# Patient Record
Sex: Male | Born: 1955 | Race: White | Hispanic: No | Marital: Married | Smoking: Former smoker
Health system: Southern US, Community
[De-identification: ages and names within clinical notes are randomized; demographics above are authoritative.]

## PROBLEM LIST (undated history)

## (undated) DIAGNOSIS — C801 Malignant (primary) neoplasm, unspecified: Secondary | ICD-10-CM

## (undated) DIAGNOSIS — E7800 Pure hypercholesterolemia, unspecified: Secondary | ICD-10-CM

## (undated) HISTORY — PX: NECK SURGERY: SHX720

---

## 2005-08-22 ENCOUNTER — Ambulatory Visit: Payer: Self-pay | Admitting: Internal Medicine

## 2006-01-27 ENCOUNTER — Ambulatory Visit: Payer: Self-pay | Admitting: Gastroenterology

## 2008-05-01 ENCOUNTER — Ambulatory Visit: Payer: Self-pay | Admitting: Internal Medicine

## 2008-06-19 ENCOUNTER — Ambulatory Visit (HOSPITAL_COMMUNITY): Admission: RE | Admit: 2008-06-19 | Discharge: 2008-06-20 | Payer: Self-pay | Admitting: Neurosurgery

## 2010-05-04 LAB — CBC
HCT: 41.8 % (ref 39.0–52.0)
MCHC: 34.8 g/dL (ref 30.0–36.0)
Platelets: 204 10*3/uL (ref 150–400)
RDW: 13.1 % (ref 11.5–15.5)

## 2010-05-04 LAB — BASIC METABOLIC PANEL
BUN: 16 mg/dL (ref 6–23)
CO2: 30 mEq/L (ref 19–32)
GFR calc non Af Amer: >60 mL/min (ref >60)
Glucose, Bld: 94 mg/dL (ref 70–99)
Potassium: 4.5 mEq/L (ref 3.5–5.1)

## 2010-06-08 NOTE — Op Note (Signed)
NAME:  Mark Callahan, Mark Callahan NO.:  [id number]   MEDICAL RECORD NO.:  [id number]          PATIENT TYPE:  OIB   LOCATION:  3523                         FACILITY:  [facility]   PHYSICIAN:  Cristi Loron, M.D.DATE OF BIRTH:  04/14/1955   DATE OF PROCEDURE:  06/19/2008  DATE OF DISCHARGE:                               OPERATIVE REPORT   BRIEF HISTORY:  The patient is a 55 year old white male who has suffered  from neck and arm pain consistent with a cervical radiculopathy.  The  patient has failed medical management worked up to be cervical MRI,  which demonstrated that the patient had a C4-5, C5-6 degeneration,  spondylosis, spondylolisthesis, stenosis, etc.  I discussed various  treatment with the patient including surgery.  The patient has weighed  the risks, benefits, and alternatives of surgery and started to proceed  with a C5 corpectomy with instrumentation and fusion of C4-C6.   PREOPERATIVE DIAGNOSES:  C4-5, C5-6 spondylosis, stenosis, cervical  radiculopathy, cervicalgia, spondylolisthesis.   POSTOPERATIVE DIAGNOSES:  C4-5, C5-6 spondylosis, stenosis, cervical  radiculopathy, cervicalgia, spondylolisthesis.   PROCEDURE:  C5 corpectomy; C4-5 and C5-6 anterior interbody arthrodesis  with local morselized autograft bone and active fused bone graft  extender; insertion of PEEK interbody prosthesis in the C5 corpectomy  site; anterior cervical plating at C4-C6 with Codman slim-lock Titanium  plate and screws.   SURGEON:  Cristi Loron, MD   ASSISTANT:  Clydene Fake, MD   ANESTHESIA:  General endotracheal.   ESTIMATED BLOOD LOSS:  100 mL.   SPECIMENS:  None.   DRAINS:  None.   COMPLICATIONS:  None.   DESCRIPTION OF PROCEDURE:  The patient was brought to the operating room  by anesthesia team.  General endotracheal anesthesia was induced.  The  patient remained in supine position with his neck in neutral position.  The patient's anterior neck was  then shaved with clippers and prepared  with Betadine scrub and Betadine solution.  Sterile drapes were applied  and then injected the area to be incised with Marcaine with epinephrine  solution.  I used a scalpel to make a transverse incision in the  patient's left anterior neck.  I used Metzenbaum scissors to divide the  platysma muscle and then to dissect medial to sternocleidomastoid  muscle, jugular vein and carotid artery.  I carefully dissected down  towards the intracervical spine identifying the esophagus and retracting  it medially.  We then used Kitner swab to clear the soft tissue from the  anterior cervical spine and then inserted a bent spinal needle into the  upper exposed intervertebral disk space.  We obtained intraoperative  radiograph to confirm our location.  We then used electrocautery to  detach the medial border of the longus colli muscle bilaterally from C4-  5 and C5-6 intervertebral space.  We inserted the Caspar self-retaining  retractor underneath the longus colli muscle bilaterally for exposure.   We began the decompression by incising the C4-5 and C5-6, intervertebral  disk with a 15 blade scalpel.  We performed a partial intervertebral  diskectomy with a pituitary forceps.  We then inserted distraction  screws into C4 and C6 and distracted the interspaces.  We then used high-  speed drill to drill away the remainder of the C4-5 and C5-6  intervertebral disk to drill away some posterior spondylosis and to thin  out the posterior longitudinal ligament.  We completed the C4-C5  corpectomy using the Leksell rongeur and saved some of this bone we  obtained to be later used as local autograft bone, but the majority of  the corpectomy was done with a high-speed drill.  We then incised the  posterior longitudinal ligament and removed the remainder of the C4-5  and C5-6 intervertebral disk.  We undercut the vertebral endplates at C4-  C6 decompressing the thecal  sac/Spinal cord, we then performed  foraminotomies about the bilateral C5 and C6 nerve roots completing the  decompression.   We now turned our attention to arthrodesis.  We started to use a 24-mm  PEEK, Medtronic (interbody prosthesis).  We assembled the prosthesis and  then prefilled it with a combination of local morselized autograft bone.  We obtained adequate decompression as well as Actifuse bone graft  extender.  We inserted prosthesis into the distracted C5 corpectomy  site.  There was good snug fit of the prosthesis.  We then removed  distraction screws after good snug fit of the prosthesis in the  interspace.  We obtained an intraoperative radiograph to confirm good  position of the prosthesis.   We now turned our attention to the anterior spinal instrumentation.  Having completed the interbody fusion and placement of the prosthesis,  we now turned our attention to the anterior spinal instrumentation.  We  obtained the appropriate length Atlantis transitional plate.  We drilled  out some ventral spondylosis from the vertebral endplates at C4-6, and  the plate will lay down flat.  We laid the plate along the anterior  aspects of the vertebral bodies from C4-C6.  We then used a drill to  drill 30-mm holes at C4-C6, we then secured the plate at vertebral  bodies by placing two 40-mm screws at C4 and 2 at C6 with a good bony  purchase.  We then secured the screws to the plate by locking each cam.  We obtained a final x-ray, which demonstrated good position of plates,  screws, and interbody prosthesis (this has limited the visualization of  the lower plate screws because of the patient's shoulders).  We then  obtained hemostasis using bipolar cautery.  We irrigated the wound out  with bacitracin solution.  We removed the retractors.  We inspected the  esophagus for any damage, but was not apparent and then we  reapproximated the patient's platysma muscle with interrupted 3-0  Vicryl  suture, subcutaneous using the interrupted 3-0 Vicryl suture and the  skin with Steri-Strips and Benzoin.  The wound was then coated with  bacitracin ointment and sterile dressing was applied.  Drapes were  removed and the patient was subsequently extubated by anesthesia team  and transported to post anesthesia care unit in stable condition.  All  sponge, instrument, and needle counts were correct at the end of this  case.      Cristi Loron, M.D.  Electronically Signed     JDJ/[id number]  D:  06/19/2008  T:  06/20/2008  Job:  [id number]

## 2010-07-18 IMAGING — CR DG CHEST 2V
2 series · 2 of 2 positions shown · non-contrast
Comparison: None

CLINICAL DATA: Cervical spondylosis.  Preop respiratory exam.

CHEST - 2 VIEW

[view not recorded (1 of 2)]
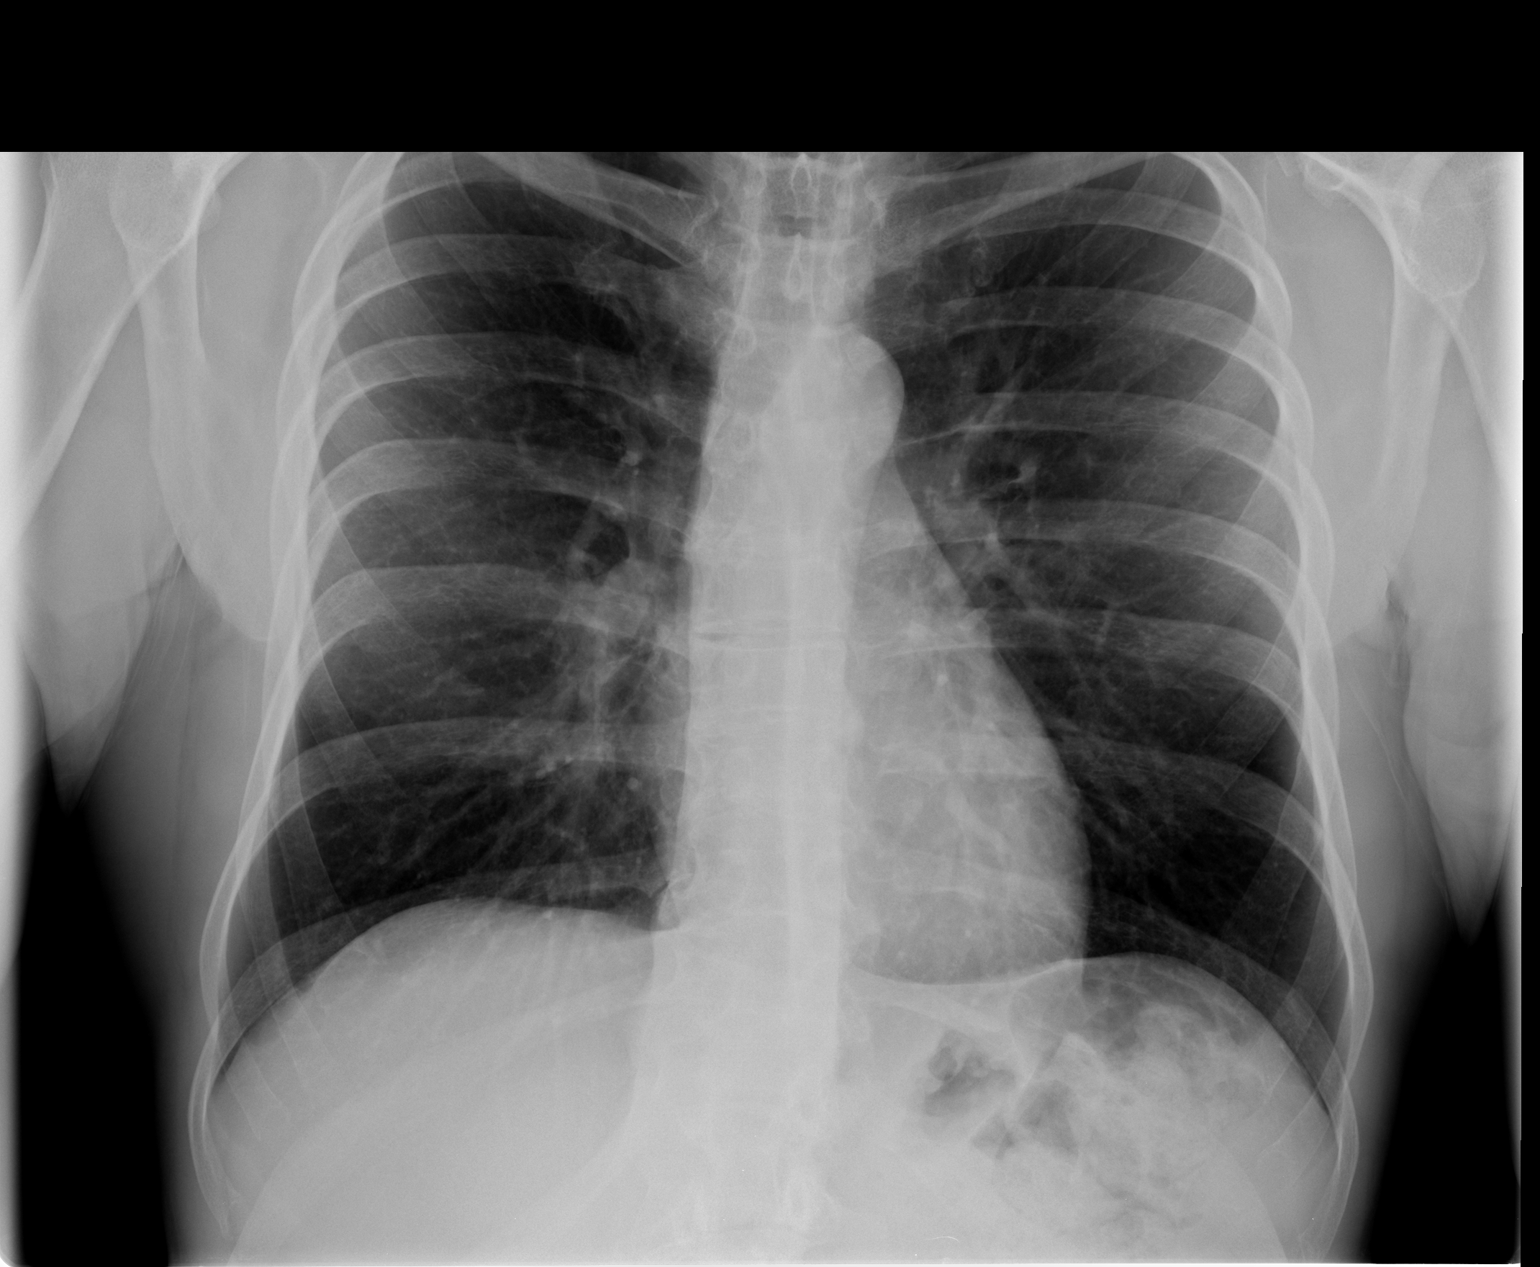

[view not recorded (2 of 2)]
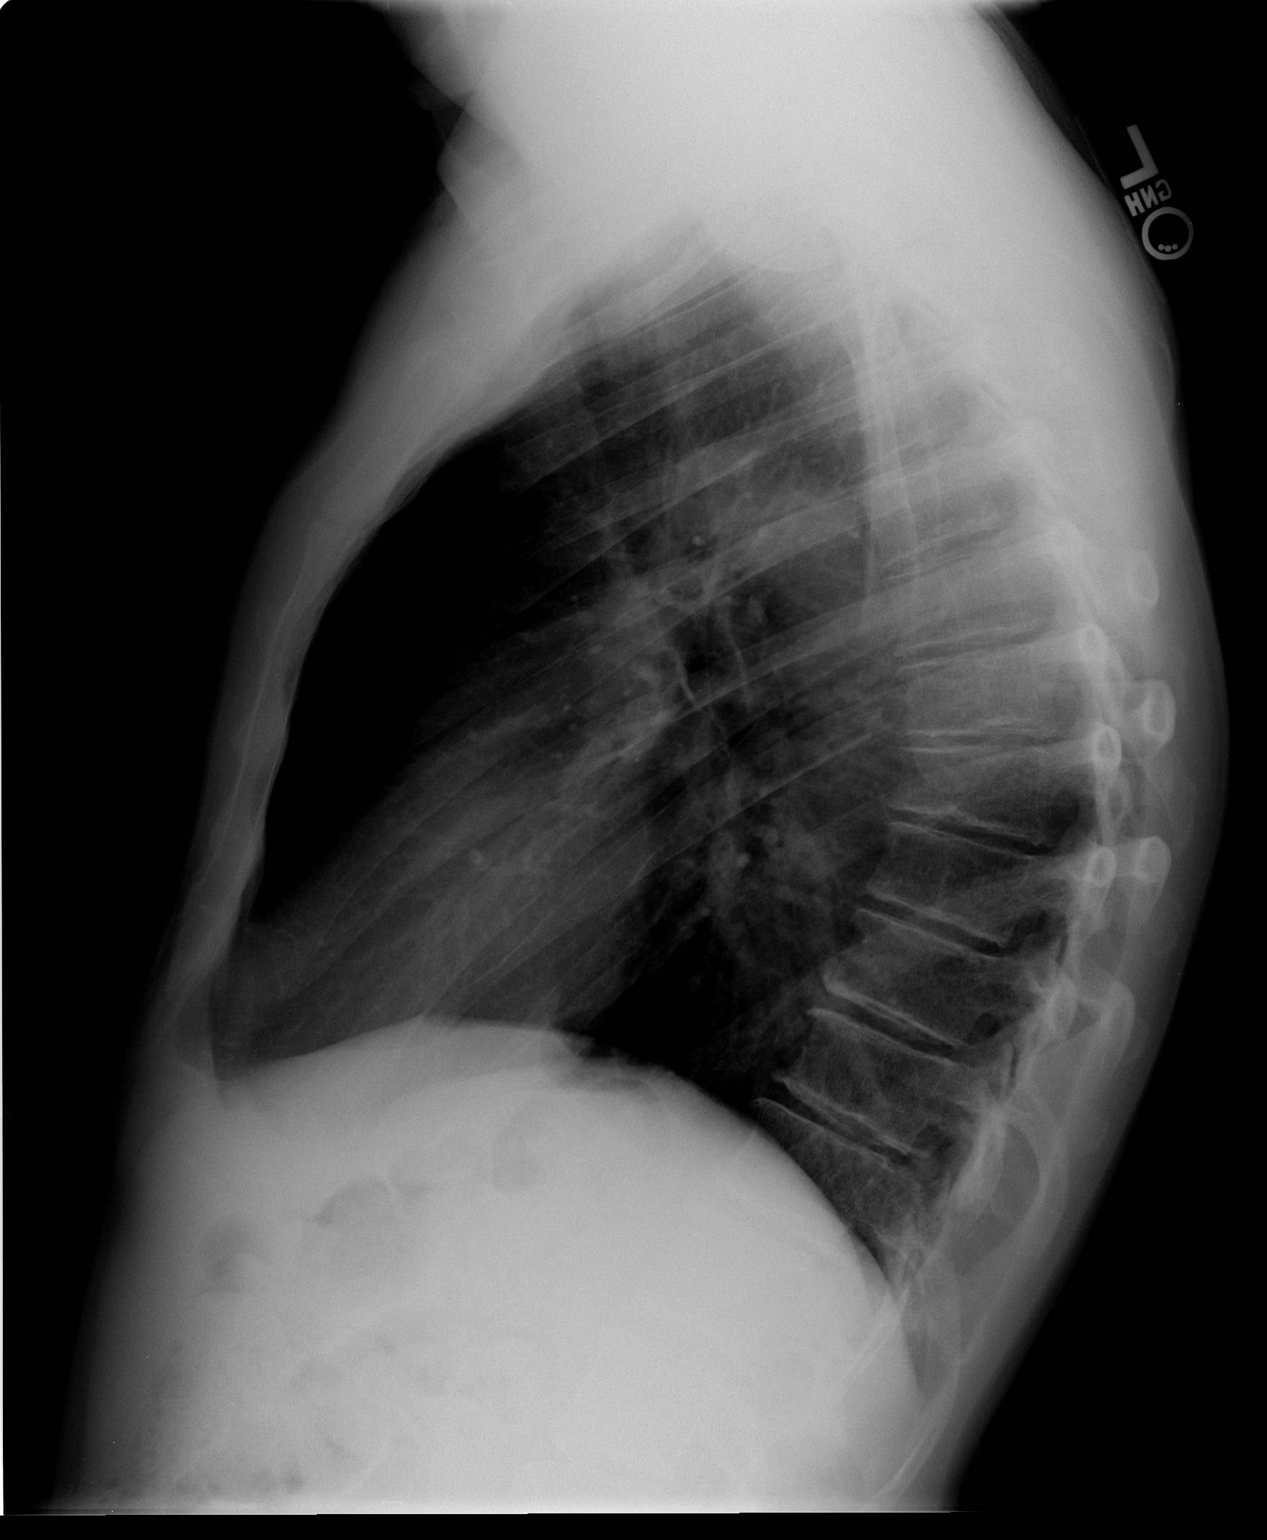

[2 of 2 positions shown; findings below may reference images not displayed]

FINDINGS: The cardiac silhouette, mediastinal and hilar contours
are within normal limits.  Lungs are clear.  The bony thorax is
intact.
IMPRESSION: No acute cardiopulmonary findings.

## 2013-05-17 ENCOUNTER — Ambulatory Visit: Payer: Self-pay | Admitting: Internal Medicine

## 2015-11-06 ENCOUNTER — Other Ambulatory Visit: Payer: Self-pay | Admitting: Physician Assistant

## 2015-11-06 DIAGNOSIS — M546 Pain in thoracic spine: Secondary | ICD-10-CM

## 2015-11-25 ENCOUNTER — Ambulatory Visit
Admission: RE | Admit: 2015-11-25 | Discharge: 2015-11-25 | Disposition: A | Discharge status: Home / Self Care | Payer: BLUE CROSS/BLUE SHIELD | Source: Ambulatory Visit | Attending: Physician Assistant | Admitting: Physician Assistant

## 2015-11-25 DIAGNOSIS — M546 Pain in thoracic spine: Secondary | ICD-10-CM

## 2015-11-25 DIAGNOSIS — M5124 Other intervertebral disc displacement, thoracic region: Secondary | ICD-10-CM | POA: Insufficient documentation

## 2015-12-07 ENCOUNTER — Other Ambulatory Visit
Admission: RE | Admit: 2015-12-07 | Discharge: 2015-12-07 | Disposition: A | Discharge status: Home / Self Care | Payer: BLUE CROSS/BLUE SHIELD | Source: Ambulatory Visit | Attending: Internal Medicine | Admitting: Internal Medicine

## 2015-12-07 ENCOUNTER — Other Ambulatory Visit: Payer: Self-pay | Admitting: Internal Medicine

## 2015-12-07 DIAGNOSIS — R1013 Epigastric pain: Secondary | ICD-10-CM | POA: Diagnosis not present

## 2015-12-07 DIAGNOSIS — R700 Elevated erythrocyte sedimentation rate: Secondary | ICD-10-CM

## 2015-12-07 DIAGNOSIS — R7989 Other specified abnormal findings of blood chemistry: Secondary | ICD-10-CM

## 2015-12-07 LAB — FIBRIN DERIVATIVES D-DIMER (ARMC ONLY): FIBRIN DERIVATIVES D-DIMER (ARMC): 1526 — AB (ref 0–499)

## 2015-12-08 ENCOUNTER — Ambulatory Visit
Admission: RE | Admit: 2015-12-08 | Discharge: 2015-12-08 | Disposition: A | Discharge status: Home / Self Care | Payer: BLUE CROSS/BLUE SHIELD | Source: Ambulatory Visit | Attending: Internal Medicine | Admitting: Internal Medicine

## 2015-12-08 DIAGNOSIS — M47894 Other spondylosis, thoracic region: Secondary | ICD-10-CM | POA: Insufficient documentation

## 2015-12-08 DIAGNOSIS — I2510 Atherosclerotic heart disease of native coronary artery without angina pectoris: Secondary | ICD-10-CM | POA: Diagnosis not present

## 2015-12-08 DIAGNOSIS — R1310 Dysphagia, unspecified: Secondary | ICD-10-CM | POA: Diagnosis not present

## 2015-12-08 DIAGNOSIS — R7989 Other specified abnormal findings of blood chemistry: Secondary | ICD-10-CM

## 2015-12-08 DIAGNOSIS — R1013 Epigastric pain: Secondary | ICD-10-CM

## 2015-12-08 HISTORY — DX: Malignant (primary) neoplasm, unspecified: C80.1

## 2015-12-08 MED ORDER — IOPAMIDOL (ISOVUE-370) INJECTION 76%
75.0000 mL | Freq: Once | INTRAVENOUS | Status: AC | PRN
  Administered 2015-12-08: 75 mL via INTRAVENOUS

## 2015-12-10 ENCOUNTER — Ambulatory Visit
Admission: RE | Admit: 2015-12-10 | Discharge: 2015-12-10 | Disposition: A | Discharge status: Home / Self Care | Payer: BLUE CROSS/BLUE SHIELD | Source: Ambulatory Visit | Attending: Internal Medicine | Admitting: Internal Medicine

## 2015-12-10 DIAGNOSIS — R1013 Epigastric pain: Secondary | ICD-10-CM | POA: Insufficient documentation

## 2015-12-10 DIAGNOSIS — N200 Calculus of kidney: Secondary | ICD-10-CM | POA: Insufficient documentation

## 2015-12-10 DIAGNOSIS — N281 Cyst of kidney, acquired: Secondary | ICD-10-CM | POA: Insufficient documentation

## 2015-12-10 DIAGNOSIS — I700 Atherosclerosis of aorta: Secondary | ICD-10-CM | POA: Insufficient documentation

## 2015-12-10 DIAGNOSIS — R700 Elevated erythrocyte sedimentation rate: Secondary | ICD-10-CM | POA: Insufficient documentation

## 2015-12-10 LAB — POCT I-STAT CREATININE: CREATININE: 0.8 mg/dL (ref 0.61–1.24)

## 2015-12-10 MED ORDER — IOPAMIDOL (ISOVUE-300) INJECTION 61%
100.0000 mL | Freq: Once | INTRAVENOUS | Status: AC | PRN
  Administered 2015-12-10: 100 mL via INTRAVENOUS

## 2015-12-14 ENCOUNTER — Ambulatory Visit: Payer: BLUE CROSS/BLUE SHIELD

## 2015-12-31 ENCOUNTER — Emergency Department: Payer: BLUE CROSS/BLUE SHIELD

## 2015-12-31 ENCOUNTER — Inpatient Hospital Stay (HOSPITAL_COMMUNITY)
Admission: AD | Admit: 2015-12-31 | Discharge: 2016-01-03 | DRG: 378 | Disposition: A | Discharge status: Home / Self Care | Payer: BLUE CROSS/BLUE SHIELD | Source: Other Acute Inpatient Hospital | Attending: Internal Medicine | Admitting: Internal Medicine

## 2015-12-31 ENCOUNTER — Encounter: Payer: Self-pay | Admitting: Emergency Medicine

## 2015-12-31 ENCOUNTER — Emergency Department
Admission: EM | Admit: 2015-12-31 | Discharge: 2015-12-31 | Payer: BLUE CROSS/BLUE SHIELD | Attending: Emergency Medicine | Admitting: Emergency Medicine

## 2015-12-31 ENCOUNTER — Encounter (HOSPITAL_COMMUNITY): Payer: Self-pay

## 2015-12-31 DIAGNOSIS — Z79899 Other long term (current) drug therapy: Secondary | ICD-10-CM

## 2015-12-31 DIAGNOSIS — K921 Melena: Secondary | ICD-10-CM | POA: Diagnosis present

## 2015-12-31 DIAGNOSIS — K449 Diaphragmatic hernia without obstruction or gangrene: Secondary | ICD-10-CM | POA: Diagnosis not present

## 2015-12-31 DIAGNOSIS — I10 Essential (primary) hypertension: Secondary | ICD-10-CM | POA: Diagnosis not present

## 2015-12-31 DIAGNOSIS — E785 Hyperlipidemia, unspecified: Secondary | ICD-10-CM | POA: Diagnosis present

## 2015-12-31 DIAGNOSIS — Z87891 Personal history of nicotine dependence: Secondary | ICD-10-CM | POA: Diagnosis not present

## 2015-12-31 DIAGNOSIS — K209 Esophagitis, unspecified: Secondary | ICD-10-CM | POA: Diagnosis present

## 2015-12-31 DIAGNOSIS — K254 Chronic or unspecified gastric ulcer with hemorrhage: Principal | ICD-10-CM | POA: Diagnosis present

## 2015-12-31 DIAGNOSIS — D500 Iron deficiency anemia secondary to blood loss (chronic): Secondary | ICD-10-CM | POA: Diagnosis not present

## 2015-12-31 DIAGNOSIS — E872 Acidosis, unspecified: Secondary | ICD-10-CM | POA: Diagnosis present

## 2015-12-31 DIAGNOSIS — E876 Hypokalemia: Secondary | ICD-10-CM | POA: Diagnosis not present

## 2015-12-31 DIAGNOSIS — E860 Dehydration: Secondary | ICD-10-CM | POA: Diagnosis present

## 2015-12-31 DIAGNOSIS — K269 Duodenal ulcer, unspecified as acute or chronic, without hemorrhage or perforation: Secondary | ICD-10-CM | POA: Diagnosis not present

## 2015-12-31 DIAGNOSIS — Z7982 Long term (current) use of aspirin: Secondary | ICD-10-CM | POA: Diagnosis not present

## 2015-12-31 DIAGNOSIS — K922 Gastrointestinal hemorrhage, unspecified: Secondary | ICD-10-CM | POA: Diagnosis not present

## 2015-12-31 DIAGNOSIS — K2971 Gastritis, unspecified, with bleeding: Secondary | ICD-10-CM | POA: Diagnosis not present

## 2015-12-31 DIAGNOSIS — Z85828 Personal history of other malignant neoplasm of skin: Secondary | ICD-10-CM | POA: Insufficient documentation

## 2015-12-31 DIAGNOSIS — D62 Acute posthemorrhagic anemia: Secondary | ICD-10-CM | POA: Diagnosis present

## 2015-12-31 DIAGNOSIS — R55 Syncope and collapse: Secondary | ICD-10-CM | POA: Diagnosis present

## 2015-12-31 HISTORY — DX: Pure hypercholesterolemia, unspecified: E78.00

## 2015-12-31 LAB — COMPREHENSIVE METABOLIC PANEL
ALBUMIN: 1.9 g/dL — AB (ref 3.5–5.0)
ALT: 14 U/L — AB (ref 17–63)
AST: 25 U/L (ref 15–41)
Alkaline Phosphatase: 25 U/L — ABNORMAL LOW (ref 38–126)
Anion gap: 14 (ref 5–15)
BUN: 32 mg/dL — AB (ref 6–20)
CHLORIDE: 108 mmol/L (ref 101–111)
CO2: 17 mmol/L — AB (ref 22–32)
CREATININE: 1.11 mg/dL (ref 0.61–1.24)
Calcium: 7.8 mg/dL — ABNORMAL LOW (ref 8.9–10.3)
GFR calc Af Amer: >60 mL/min (ref >60)
GFR calc non Af Amer: >60 mL/min (ref >60)
GLUCOSE: 262 mg/dL — AB (ref 65–99)
Potassium: 3.8 mmol/L (ref 3.5–5.1)
SODIUM: 139 mmol/L (ref 135–145)
Total Bilirubin: 0.2 mg/dL — ABNORMAL LOW (ref 0.3–1.2)
Total Protein: 4.8 g/dL — ABNORMAL LOW (ref 6.5–8.1)

## 2015-12-31 LAB — CBC
HEMATOCRIT: 15.8 % — AB (ref 39.0–52.0)
HEMOGLOBIN: 5.3 g/dL — AB (ref 13.0–17.0)
MCH: 29.4 pg (ref 26.0–34.0)
MCHC: 33.5 g/dL (ref 30.0–36.0)
MCV: 87.8 fL (ref 78.0–100.0)
Platelets: 351 10*3/uL (ref 150–400)
RBC: 1.8 MIL/uL — AB (ref 4.22–5.81)
RDW: 13.2 % (ref 11.5–15.5)
WBC: 14.2 10*3/uL — AB (ref 4.0–10.5)

## 2015-12-31 LAB — CBC WITH DIFFERENTIAL/PLATELET
Basophils Absolute: 0 10*3/uL (ref 0–0.1)
Basophils Relative: 0 %
EOS PCT: 2 %
Eosinophils Absolute: 0.3 10*3/uL (ref 0–0.7)
HEMATOCRIT: 13.4 % — AB (ref 40.0–52.0)
Hemoglobin: 4.2 g/dL — CL (ref 13.0–18.0)
LYMPHS ABS: 2.5 10*3/uL (ref 1.0–3.6)
Lymphocytes Relative: 15 %
MCH: 28.1 pg (ref 26.0–34.0)
MCHC: 31.4 g/dL — ABNORMAL LOW (ref 32.0–36.0)
MCV: 89.3 fL (ref 80.0–100.0)
MONOS PCT: 5 %
Monocytes Absolute: 0.8 10*3/uL (ref 0.2–1.0)
NEUTROS ABS: 13.1 10*3/uL — AB (ref 1.4–6.5)
Neutrophils Relative %: 78 %
Platelets: 566 10*3/uL — ABNORMAL HIGH (ref 150–440)
RBC: 1.5 MIL/uL — AB (ref 4.40–5.90)
RDW: 14.2 % (ref 11.5–14.5)
WBC: 16.7 10*3/uL — AB (ref 3.8–10.6)

## 2015-12-31 LAB — PREPARE RBC (CROSSMATCH)

## 2015-12-31 LAB — ABO/RH
ABO/RH(D): A POS
ABO/RH(D): A POS

## 2015-12-31 LAB — LACTIC ACID, PLASMA: Lactic Acid, Venous: 8.5 mmol/L (ref 0.5–1.9)

## 2015-12-31 LAB — TROPONIN I: Troponin I: <0.03 ng/mL (ref <0.03)

## 2015-12-31 LAB — MRSA PCR SCREENING: MRSA BY PCR: NEGATIVE

## 2015-12-31 MED ORDER — SODIUM CHLORIDE 0.9 % IV SOLN
10.0000 mL/h | Freq: Once | INTRAVENOUS | Status: AC
  Administered 2015-12-31: 10 mL/h via INTRAVENOUS

## 2015-12-31 MED ORDER — ONDANSETRON HCL 4 MG/2ML IJ SOLN: 4.0000 mg | Freq: Four times a day (QID) | INTRAMUSCULAR | Status: DC | PRN

## 2015-12-31 MED ORDER — SODIUM CHLORIDE 0.9 % IV SOLN
8.0000 mg/h | INTRAVENOUS | Status: DC
  Administered 2015-12-31: 8 mg/h via INTRAVENOUS
  Filled 2015-12-31: qty 80

## 2015-12-31 MED ORDER — PANTOPRAZOLE SODIUM 40 MG IV SOLR: 40.0000 mg | Freq: Two times a day (BID) | INTRAVENOUS | Status: DC

## 2015-12-31 MED ORDER — HYDROCODONE-ACETAMINOPHEN 10-325 MG PO TABS
1.0000 | ORAL_TABLET | ORAL | Status: DC | PRN
  Administered 2015-12-31 – 2016-01-03 (×5): 1 via ORAL
  Filled 2015-12-31 (×5): qty 1

## 2015-12-31 MED ORDER — SODIUM CHLORIDE 0.9 % IV SOLN
Freq: Once | INTRAVENOUS | Status: AC
  Administered 2015-12-31: 17:00:00 via INTRAVENOUS

## 2015-12-31 MED ORDER — SODIUM CHLORIDE 0.9 % IV SOLN
8.0000 mg/h | INTRAVENOUS | Status: DC
  Administered 2015-12-31 – 2016-01-01 (×3): 8 mg/h via INTRAVENOUS
  Filled 2015-12-31 (×7): qty 80

## 2015-12-31 MED ORDER — ACETAMINOPHEN 500 MG PO TABS: 1000.0000 mg | ORAL_TABLET | Freq: Four times a day (QID) | ORAL | Status: DC | PRN

## 2015-12-31 MED ORDER — BOOST / RESOURCE BREEZE PO LIQD
1.0000 | Freq: Three times a day (TID) | ORAL | Status: DC
  Administered 2015-12-31 – 2016-01-02 (×4): 1 via ORAL

## 2015-12-31 MED ORDER — ACETAMINOPHEN 650 MG RE SUPP: 650.0000 mg | Freq: Four times a day (QID) | RECTAL | Status: DC | PRN

## 2015-12-31 MED ORDER — ORAL CARE MOUTH RINSE
15.0000 mL | Freq: Two times a day (BID) | OROMUCOSAL | Status: DC
  Administered 2016-01-01 – 2016-01-03 (×4): 15 mL via OROMUCOSAL

## 2015-12-31 MED ORDER — SODIUM CHLORIDE 0.9 % IV BOLUS (SEPSIS)
1000.0000 mL | Freq: Once | INTRAVENOUS | Status: AC
  Administered 2015-12-31: 1000 mL via INTRAVENOUS

## 2015-12-31 MED ORDER — ATORVASTATIN CALCIUM 40 MG PO TABS
40.0000 mg | ORAL_TABLET | Freq: Every evening | ORAL | Status: DC
  Administered 2015-12-31 – 2016-01-02 (×3): 40 mg via ORAL
  Filled 2015-12-31 (×3): qty 1

## 2015-12-31 MED ORDER — PIPERACILLIN-TAZOBACTAM 3.375 G IVPB 30 MIN
3.3750 g | Freq: Once | INTRAVENOUS | Status: AC
  Administered 2015-12-31: 3.375 g via INTRAVENOUS
  Filled 2015-12-31: qty 50

## 2015-12-31 MED ORDER — SODIUM CHLORIDE 0.9 % IV SOLN
80.0000 mg | INTRAVENOUS | Status: AC
  Administered 2015-12-31: 11:00:00 80 mg via INTRAVENOUS
  Filled 2015-12-31: qty 80

## 2015-12-31 MED ORDER — ONDANSETRON HCL 4 MG PO TABS: 4.0000 mg | ORAL_TABLET | Freq: Four times a day (QID) | ORAL | Status: DC | PRN

## 2015-12-31 MED ORDER — PANTOPRAZOLE SODIUM 40 MG IV SOLR
80.0000 mg | Freq: Once | INTRAVENOUS | Status: DC
  Filled 2015-12-31: qty 80

## 2015-12-31 MED ORDER — SODIUM CHLORIDE 0.9% FLUSH
3.0000 mL | Freq: Two times a day (BID) | INTRAVENOUS | Status: DC
  Administered 2015-12-31 – 2016-01-03 (×7): 3 mL via INTRAVENOUS

## 2015-12-31 MED ORDER — VITAMIN D 1000 UNITS PO TABS
1000.0000 [IU] | ORAL_TABLET | Freq: Every day | ORAL | Status: DC
  Administered 2015-12-31 – 2016-01-03 (×3): 1000 [IU] via ORAL
  Filled 2015-12-31 (×3): qty 1

## 2015-12-31 MED ORDER — ACETAMINOPHEN 325 MG PO TABS
650.0000 mg | ORAL_TABLET | Freq: Four times a day (QID) | ORAL | Status: DC | PRN
  Administered 2015-12-31 – 2016-01-02 (×3): 650 mg via ORAL
  Filled 2015-12-31 (×3): qty 2

## 2015-12-31 MED ORDER — VANCOMYCIN HCL IN DEXTROSE 1-5 GM/200ML-% IV SOLN
1000.0000 mg | Freq: Once | INTRAVENOUS | Status: AC
  Administered 2015-12-31: 1000 mg via INTRAVENOUS
  Filled 2015-12-31: qty 200

## 2015-12-31 MED ORDER — SODIUM CHLORIDE 0.9 % IV SOLN
80.0000 mg | Freq: Once | INTRAVENOUS | Status: AC
  Administered 2015-12-31: 80 mg via INTRAVENOUS
  Filled 2015-12-31: qty 80

## 2015-12-31 MED ORDER — SODIUM CHLORIDE 0.9 % IV SOLN
80.0000 mg | Freq: Once | INTRAVENOUS | Status: DC
  Filled 2015-12-31: qty 80

## 2015-12-31 MED ORDER — SODIUM CHLORIDE 0.9 % IV SOLN
INTRAVENOUS | Status: DC
  Administered 2015-12-31 – 2016-01-02 (×5): via INTRAVENOUS

## 2015-12-31 NOTE — H&P (Signed)
History and Physical        Hospital Admission Note Date: 12/31/2015  Patient name: Mark Callahan Medical record number: [id number] Date of birth: September 20, 1955 Age: 60 y.o. Gender: male  PCP: BERT Odetta Pink, MD   Referring physician: Dr Reita Cliche  Patient coming from: home    Chief Complaint:  Rectal bleeding and passed out  HPI: Patient is a 60 year old male with hyperlipidemia who presented to [facility] With rectal bleeding and syncopal episode today. History was obtained from the patient who reported that he was having back pain in the last 1 week. Hence he was taking aspirin 325 mg daily at least 6 times a day with Percocet and hydrocodone, with diclofenac. The back pain has improved however in the last 3 days he started having black stools, 2 episodes yesterday. Today while he was sitting on the toilet and felt like he had a bowel movement, he passed out. No chest pain or any palpitations, shortness of breath.   Colonoscopy at [facility] 10 years ago was normal per patient. No prior EGD.  Patient also reported intermittent sharp epigastric pain in the last 3 months, sometimes worse after eating.  ED work-up/course:  BMET unremarkable except glucose 262, troponin less than 0.03 Lactic acid 8.5 WBC 16.7, hemoglobin 4.2, hematocrit 13.4, MCV 89.3, platelets 566  Review of Systems: Positives marked in 'bold' Constitutional: Denies fever, chills, diaphoresis, poor appetite and fatigue.  HEENT: Denies photophobia, eye pain, redness, hearing loss, ear pain, congestion, sore throat, rhinorrhea, sneezing, mouth sores, trouble swallowing, neck pain, neck stiffness and tinnitus.   Respiratory: Denies SOB, DOE, cough, chest tightness,  and wheezing.   Cardiovascular: Denies chest pain, palpitations and leg swelling.  Gastrointestinal: Please see history of present illness.    Genitourinary: Denies dysuria, urgency, frequency, hematuria, flank pain and difficulty urinating.  Musculoskeletal: Denies myalgias, back pain, joint swelling, arthralgias and gait problem.  Skin: Denies pallor, rash and wound.  Neurological: Denies dizziness, seizures, syncope, weakness, light-headedness, numbness and headaches.  Hematological: Denies adenopathy. Easy bruising, personal or family bleeding history  Psychiatric/Behavioral: Denies suicidal ideation, mood changes, confusion, nervousness, sleep disturbance and agitation  Past Medical History: Past Medical History:  Diagnosis Date  . Cancer ([place])    skin ca  . High cholesterol     Past Surgical History:  Procedure Laterality Date  . NECK SURGERY      Medications: Prior to Admission medications   Medication Sig Start Date End Date Taking? Authorizing Provider  acetaminophen (TYLENOL) 500 MG tablet Take 1,000 mg by mouth every 6 (six) hours as needed for moderate pain.   Yes Historical Provider, MD  aspirin 325 MG tablet Take 650 mg by mouth 3 (three) times daily as needed for moderate pain or headache.   Yes Historical Provider, MD  aspirin EC 81 MG tablet Take 81 mg by mouth daily.   Yes Historical Provider, MD  atorvastatin (LIPITOR) 40 MG tablet Take 40 mg by mouth every evening.  06/04/15  Yes Historical Provider, MD  B Complex-Biotin-FA (B-COMPLEX PO) Take 1 tablet by mouth daily.   Yes Historical Provider, MD  Cholecalciferol (VITAMIN D3) 1000 units CAPS  Take 1,000 Units by mouth daily.   Yes Historical Provider, MD  Cyanocobalamin (B-12 PO) Take 1 tablet by mouth daily with lunch.    Yes Historical Provider, MD  diazepam (VALIUM) 5 MG tablet Take 5 mg by mouth every 12 (twelve) hours as needed for anxiety. 12/07/15  Yes Historical Provider, MD  diclofenac (VOLTAREN) 75 MG EC tablet Take 75 mg by mouth 2 (two) times daily. inflammation 06/04/15  Yes Historical Provider, MD  DOCOSAHEXAENOIC ACID PO Take 1 g by mouth 2  (two) times daily.   Yes Historical Provider, MD  Ferrous Sulfate (IRON) 325 (65 Fe) MG TABS Take 162.5 mg by mouth daily.   Yes Historical Provider, MD  HYDROcodone-acetaminophen (NORCO) 10-325 MG tablet Take 1 tablet by mouth every 8 (eight) hours as needed for pain. 02/01/16  Yes Historical Provider, MD  Multiple Vitamin (MULTI-VITAMINS) TABS Take 1 tablet by mouth daily.   Yes Historical Provider, MD  oxyCODONE-acetaminophen (PERCOCET) 10-325 MG tablet Take 1 tablet by mouth every 8 (eight) hours as needed for pain. 12/23/15  Yes Historical Provider, MD  pyridOXINE (B-6) 50 MG tablet Take 100 mg by mouth every evening.    Yes Historical Provider, MD  predniSONE (DELTASONE) 10 MG tablet Take 10-40 mg by mouth as directed. Take 40mg  for 2 days, then 30mg  for 2 days, then 20mg  for 2 days and then 10mg  for 2 days, completed course around 12/31/15 12/23/15 01/02/16  Historical Provider, MD    Allergies:  No Known Allergies  Social History:  has no tobacco, alcohol, and drug history on file.  Family History: No family history on file.  Physical Exam: Blood pressure 119/62, temperature 98.4 F (36.9 C), temperature source Oral, resp. rate 19, height 5\' 8"  (1.727 m), weight 72.1 kg (158 lb 15.2 oz). General: Alert, awake, oriented x3, in no acute distress. HEENT: normocephalic, atraumatic, anicteric sclera, pale conjunctiva, pupils equal and reactive to light and accomodation, oropharynx clear Neck: supple, no masses or lymphadenopathy, no goiter, no bruits  Heart: Regular rate and rhythm, without murmurs, rubs or gallops. Lungs: Clear to auscultation bilaterally, no wheezing, rales or rhonchi. Abdomen: Soft, nontender, nondistended, positive bowel sounds, no masses. Extremities: No clubbing, cyanosis or edema with positive pedal pulses. Neuro: Grossly intact, no focal neurological deficits, strength 5/5 upper and lower extremities bilaterally Psych: alert and oriented x 3, normal mood and  affect Skin: no rashes or lesions, warm and dry, pale   LABS on Admission:  Basic Metabolic Panel:  Recent Labs Lab 12/31/15 1003  NA 139  K 3.8  CL 108  CO2 17*  GLUCOSE 262*  BUN 32*  CREATININE 1.11  CALCIUM 7.8*   Liver Function Tests:  Recent Labs Lab 12/31/15 1003  AST 25  ALT 14*  ALKPHOS 25*  BILITOT 0.2*  PROT 4.8*  ALBUMIN 1.9*   No results for input(s): LIPASE, AMYLASE in the last 168 hours. No results for input(s): AMMONIA in the last 168 hours. CBC:  Recent Labs Lab 12/31/15 1003  WBC 16.7*  NEUTROABS 13.1*  HGB 4.2*  HCT 13.4*  MCV 89.3  PLT 566*   Cardiac Enzymes:  Recent Labs Lab 12/31/15 1003  TROPONINI <0.03   BNP: Invalid input(s): POCBNP CBG: No results for input(s): GLUCAP in the last 168 hours.  Radiological Exams on Admission:  No results found.  *I have personally reviewed the images above*     Assessment/Plan Principal Problem:   Acute blood loss anemia Secondary to GI bleed :  Secondary to excessive aspirin and NSAID use, hemoglobin 4.2 with time of admission - admit to SDU, placed on IV fluid hydration.  - per EDP at [facility], patient was transfused 2 units at [facility] ED. Repeat CBC and patient will likely need 2 more units of packed RBC transfusion, will order, to the goal hemoglobin more than 8 - No NSAIDs, placed on PPI drip, GI consulted, EGD in a.m. - Clear liquid diet for now, nothing by mouth after midnight - Anemia panel  Active Problems:    Hyperlipidemia - Continue statin    Lactic acidosis, Dehydration - Repeat lactic acid in a.m., continue IV fluid hydration   DVT prophylaxis: SCDs  CODE STATUS: Full CODE STATUS  Consults called:   Family Communication: Admission, patients condition and plan of care including tests being ordered have been discussed with the patient who indicates understanding and agree with the plan and Code Status  Admission status:   Disposition  plan: Further plan will depend as patient's clinical course evolves and further radiologic and laboratory data become available. At the time of admission, it appears that the appropriate admission status for this patient is INPATIENT . This is judged to be reasonable and necessary in order to provide the required intensity of service to ensure the patient's safety given the presenting symptoms, physical exam findings, and initial radiographic and laboratory data in the context of their chronic comorbidities.     Time Spent on Admission: 55mins   Andee Chivers M.D. [facility] 12/31/2015, 2:49 PM Pager: [phone]  If 7PM-7AM, please contact night-coverage [email or web address] Password [email or web address]

## 2015-12-31 NOTE — Plan of Care (Signed)
Called by carelink from [facility] ED by Dr Reita Cliche  Briefly 60 year old male presenting with melanotic stools for 3 days  and syncopal episode.   Hemoglobin 4.2, hematocrit 13.4. Lactic acid 8.5. Creatinine 1.1. WBC's 16.7. Initially patient was hypotensive, BP in the 90s however has improved to 101/60 with IV fluids. Initially hypothermic with temperature 95.92F now improved to 99.  No GI coverage at [facility]  Per Dr. Reita Cliche, blood cultures obtained, patient started on empiric broad-spectrum IV antibiotics and 3 units packed RBC transfusion. She will contact GI at [place].  Accepted to stepdown. Will need to notify GI on arrival. May need CT of the abdomen and pelvis to rule out any colitis.   RAI,RIPUDEEP M.D. [facility] 12/31/2015, 12:47 PM  Pager: [phone]

## 2015-12-31 NOTE — ED Triage Notes (Signed)
Pt here from home via [facility] after some rectal bleeding and syncopal episode. EMS gave 250cc of fluid IV, pt A/O upon arrival.

## 2015-12-31 NOTE — Consult Note (Signed)
[facility] Consultation Note  Referring Provider:  Dr. Estill Cotta [facility]) Primary Care Physician:  [name], MD  Reason for Consultation:  Melena, anemia  HPI: Mark Callahan is a 60 y.o. male admitted for above reasons.  Patient had syncopal episode today, found to have Hgb 4.2.  Has had few days' of of melena.  Chronic occasional blood on tissue paper with passage of hard stools in setting of chronic narcotic use for back pain.  Has had escalating back pain recently, and has been taking several aspirin per day and a few acetaminophen per day as well.  No hematemesis.  23-month history of intermittent sharp epigastric pain, worse with movement and sometimes worse after eating.  Colonoscopy in [place] about 10 years ago, normal per patient recollection.  No prior endoscopy.   Past Medical History:  Diagnosis Date  . Cancer ([place])    skin ca  . High cholesterol     Past Surgical History:  Procedure Laterality Date  . NECK SURGERY      Prior to Admission medications   Medication Sig Start Date End Date Taking? Authorizing Provider  acetaminophen (TYLENOL) 500 MG tablet Take 1,000 mg by mouth every 6 (six) hours as needed for moderate pain.   Yes Historical Provider, MD  aspirin 325 MG tablet Take 650 mg by mouth 3 (three) times daily as needed for moderate pain or headache.   Yes Historical Provider, MD  aspirin EC 81 MG tablet Take 81 mg by mouth daily.   Yes Historical Provider, MD  atorvastatin (LIPITOR) 40 MG tablet Take 40 mg by mouth every evening.  06/04/15  Yes Historical Provider, MD  B Complex-Biotin-FA (B-COMPLEX PO) Take 1 tablet by mouth daily.   Yes Historical Provider, MD  Cholecalciferol (VITAMIN D3) 1000 units CAPS Take 1,000 Units by mouth daily.   Yes Historical Provider, MD  Cyanocobalamin (B-12 PO) Take 1 tablet by mouth daily with lunch.    Yes Historical Provider, MD  diazepam (VALIUM) 5 MG tablet Take 5 mg by mouth every 12 (twelve) hours as  needed for anxiety. 12/07/15  Yes Historical Provider, MD  diclofenac (VOLTAREN) 75 MG EC tablet Take 75 mg by mouth 2 (two) times daily. inflammation 06/04/15  Yes Historical Provider, MD  DOCOSAHEXAENOIC ACID PO Take 1 g by mouth 2 (two) times daily.   Yes Historical Provider, MD  Ferrous Sulfate (IRON) 325 (65 Fe) MG TABS Take 162.5 mg by mouth daily.   Yes Historical Provider, MD  HYDROcodone-acetaminophen (NORCO) 10-325 MG tablet Take 1 tablet by mouth every 8 (eight) hours as needed for pain. 02/01/16  Yes Historical Provider, MD  Multiple Vitamin (MULTI-VITAMINS) TABS Take 1 tablet by mouth daily.   Yes Historical Provider, MD  oxyCODONE-acetaminophen (PERCOCET) 10-325 MG tablet Take 1 tablet by mouth every 8 (eight) hours as needed for pain. 12/23/15  Yes Historical Provider, MD  pyridOXINE (B-6) 50 MG tablet Take 100 mg by mouth every evening.    Yes Historical Provider, MD  predniSONE (DELTASONE) 10 MG tablet Take 10-40 mg by mouth as directed. Take 40mg  for 2 days, then 30mg  for 2 days, then 20mg  for 2 days and then 10mg  for 2 days, completed course around 12/31/15 12/23/15 01/02/16  Historical Provider, MD    Current Facility-Administered Medications  Medication Dose Route Frequency Provider Last Rate Last Dose  . 0.9 %  sodium chloride infusion   Intravenous Continuous Ripudeep Krystal Eaton, MD      .  0.9 %  sodium chloride infusion   Intravenous Once Ripudeep K Rai, MD      . acetaminophen (TYLENOL) tablet 650 mg  650 mg Oral Q6H PRN Ripudeep Krystal Eaton, MD       Or  . acetaminophen (TYLENOL) suppository 650 mg  650 mg Rectal Q6H PRN Ripudeep K Rai, MD      . atorvastatin (LIPITOR) tablet 40 mg  40 mg Oral QPM Ripudeep K Rai, MD      . cholecalciferol (VITAMIN D) tablet 1,000 Units  1,000 Units Oral Daily Ripudeep K Rai, MD      . HYDROcodone-acetaminophen (NORCO) 10-325 MG per tablet 1 tablet  1 tablet Oral Q4H PRN Ripudeep K Rai, MD      . MEDLINE mouth rinse  15 mL Mouth Rinse BID Ripudeep K  Rai, MD      . ondansetron (ZOFRAN) tablet 4 mg  4 mg Oral Q6H PRN Ripudeep Krystal Eaton, MD       Or  . ondansetron (ZOFRAN) injection 4 mg  4 mg Intravenous Q6H PRN Ripudeep K Rai, MD      . pantoprazole (PROTONIX) 80 mg in sodium chloride 0.9 % 100 mL IVPB  80 mg Intravenous Once Ripudeep K Rai, MD      . pantoprazole (PROTONIX) 80 mg in sodium chloride 0.9 % 250 mL (0.32 mg/mL) infusion  8 mg/hr Intravenous Continuous Ripudeep K Rai, MD      . Derrill Memo ON 01/04/2016] pantoprazole (PROTONIX) injection 40 mg  40 mg Intravenous Q12H Ripudeep K Rai, MD      . sodium chloride flush (NS) 0.9 % injection 3 mL  3 mL Intravenous Q12H Ripudeep Krystal Eaton, MD        Allergies as of 12/31/2015  . (No Known Allergies)    No family history on file.  Social History   Social History  . Marital status: Married    Spouse name: N/A  . Number of children: N/A  . Years of education: N/A   Occupational History  . Not on file.   Social History Main Topics  . Smoking status: Not on file  . Smokeless tobacco: Not on file  . Alcohol use Not on file  . Drug use: Unknown  . Sexual activity: Not on file   Other Topics Concern  . Not on file   Social History Narrative  . No narrative on file    Review of Systems: Positive = bold Gen: Denies any fever, chills, rigors, night sweats, anorexia, fatigue, weakness, malaise, involuntary weight loss, and sleep disorder CV: Denies chest pain, angina, palpitations, syncope, orthopnea, PND, peripheral edema, and claudication. Resp: Denies dyspnea, cough, sputum, wheezing, coughing up blood. GI: Described in detail in HPI.    GU : Denies urinary burning, blood in urine, urinary frequency, urinary hesitancy, nocturnal urination, and urinary incontinence. MS: Denies back pain or swelling.  Denies muscle weakness, cramps, atrophy.  Derm: Denies rash, itching, oral ulcerations, hives, unhealing ulcers.  Psych: Denies depression, anxiety, memory loss, suicidal ideation,  hallucinations,  and confusion. Heme: Denies bruising, bleeding, and enlarged lymph nodes. Neuro:  Denies any headaches, dizziness, paresthesias. Endo:  Denies any problems with DM, thyroid, adrenal function.  Physical Exam: Vital signs in last 24 hours: Temp:  [95.8 F (35.4 C)-99.6 F (37.6 C)] 98.4 F (36.9 C) (12/07 1400) Pulse Rate:  [102-122] 112 (12/07 1249) Resp:  [14-21] 19 (12/07 1400) BP: (100-119)/(52-89) 119/62 (12/07 1400) SpO2:  [99 %-100 %] 100 % (  12/07 1249) Weight:  [69.4 kg (153 lb)-72.1 kg (158 lb 15.2 oz)] 72.1 kg (158 lb 15.2 oz) (12/07 1400)   General:   Alert,  Well-developed, well-nourished, pleasant and cooperative in NAD Head:  Normocephalic and atraumatic. Eyes:  Sclera clear, no icterus.   Conjunctiva pale Ears:  Normal auditory acuity. Nose:  No deformity, discharge,  or lesions. Mouth:  No deformity or lesions.  Oropharynx dry and pale Neck:  Supple; no masses or thyromegaly. Lungs:  Clear throughout to auscultation.   No wheezes, crackles, or rhonchi. No acute distress. Heart:  Regular rate and rhythm; no murmurs, clicks, rubs,  or gallops. Abdomen:  Soft, mild epigastric tenderness without peritonitis, nondistended. No masses, hepatosplenomegaly or hernias noted. Normal bowel sounds, without guarding, and without rebound.     Msk:  Symmetrical without gross deformities. Normal posture. Pulses:  Normal pulses noted. Extremities:  Without clubbing or edema. Neurologic:  Alert and  oriented x4; diffusely weak, otherwise grossly normal neurologically. Skin:  Markedly pale; otherwise intact without significant lesions or rashes. Psych:  Alert and cooperative. Normal mood and affect.   Lab Results:  Recent Labs  12/31/15 1003  WBC 16.7*  HGB 4.2*  HCT 13.4*  PLT 566*   BMET  Recent Labs  12/31/15 1003  NA 139  K 3.8  CL 108  CO2 17*  GLUCOSE 262*  BUN 32*  CREATININE 1.11  CALCIUM 7.8*   LFT  Recent Labs  12/31/15 1003  PROT  4.8*  ALBUMIN 1.9*  AST 25  ALT 14*  ALKPHOS 25*  BILITOT 0.2*   PT/INR No results for input(s): LABPROT, INR in the last 72 hours.  Studies/Results: Dg Chest Port 1 View  Result Date: 12/31/2015 CLINICAL DATA:  Rectal bleeding. EXAM: PORTABLE CHEST 1 VIEW COMPARISON:  CT 12/08/2015 . FINDINGS: Mediastinum and hilar structures normal. Cardiomegaly with normal pulmonary vascularity. No focal infiltrate. No pleural effusion or pneumothorax. Prior cervical spine fusion. IMPRESSION: No acute cardiopulmonary disease. Electronically Signed   By: Marcello Moores  Register   On: 12/31/2015 10:33   Impression:  1.  Melena. Non-destabilizing. 2.  Acute blood loss anemia. 3.  Epigastric pain, progressive for several months. 4.  Weakness and dehydration.  Plan:  1.  Volume repletion. 2.  Blood transfusion, Hgb goal >/= 8. 3.  PPI drip. 4.  Endoscopy tomorrow. 5.  Risks (bleeding, infection, bowel perforation that could require surgery, sedation-related changes in cardiopulmonary systems), benefits (identification and possible treatment of source of symptoms, exclusion of certain causes of symptoms), and alternatives (watchful waiting, radiographic imaging studies, empiric medical treatment) of upper endoscopy (EGD) were explained to patient/family in detail and patient wishes to proceed. 6.  Next step in management pending EGD findings.  7.  [facility] will follow; case discussed with Dr. Tana Coast of [facility] team.   LOS: 0 days   [name]  12/31/2015, 3:01 PM  Pager [phone] If no answer or after 5 PM call [phone]

## 2015-12-31 NOTE — Progress Notes (Signed)
[place] rounding the ED was asked by the Nurse to visit the Pt in [id number]. [place] visited Pt, but the medical teams was in working on the Pt. [place] visited Pt again, Pt was being transfer to [facility]; [facility] prayed with Pt's wife and provided spiritual support.     12/31/15 1600  Clinical Encounter Type  Visited With Patient  Visit Type Initial;Spiritual support  Referral From Nurse  Consult/Referral To Chaplain  Spiritual Encounters  Spiritual Needs Prayer;Other (Comment)  Advance Directives (For Healthcare)  Does Patient Have a Medical Advance Directive? No  Would patient like information on creating a medical advance directive? No - Patient declined  [place]  Does Patient Have a Mental Health Advance Directive? No  Would patient like information on creating a mental health advance directive? No - Patient declined

## 2015-12-31 NOTE — ED Notes (Signed)
This RN notified MD of turned off bear hugger due to patient rectal temp of 99.3. This RN also spoke with MD regarding rate change. MD states transfuse 1 unit over 1 hr.

## 2015-12-31 NOTE — ED Notes (Signed)
Pt placed on bair hugger

## 2015-12-31 NOTE — ED Notes (Signed)
Lab calls says that blood culture needs to be redrawn. Sent to no label. Reita Cliche, MD informed. [place], request new draw.

## 2015-12-31 NOTE — ED Provider Notes (Signed)
[facility] Emergency Department Provider Note ____________________________________________   I have reviewed the triage vital signs and the triage nursing note.  HISTORY  Chief Complaint Loss of Consciousness   Historian Patient  HPI Mark Callahan is a 60 y.o. male brought in by ems for black stool and syncope.  Told recently he was anemic and did not at that time have black stools.  But for the past 3-4 days he's had black stools.  Today called ems for rectal bleeding, cramping and while EMS was there the patient felt like had a bowel movement and when he was sitting on the toilet he did not go anything, but then passed out in their arms. He came back within a few seconds. No seizure activity. No reported recent fevers, vomiting, respiratory symptoms or fevers for the patient at this point time.  Denies chest pain or trouble breathing.    Past Medical History:  Diagnosis Date  . Cancer ([place])    skin ca  . High cholesterol     There are no active problems to display for this patient.   Past Surgical History:  Procedure Laterality Date  . NECK SURGERY      Prior to Admission medications   Not on File    No Known Allergies  No family history on file.  Social History Social History  Substance Use Topics  . Smoking status: Not on file  . Smokeless tobacco: Not on file  . Alcohol use Not on file    Review of Systems  Constitutional: Negative for fever. Eyes: Negative for visual changes. ENT: Negative for sore throat. Cardiovascular: Negative for chest pain. Respiratory: Negative for shortness of breath. Gastrointestinal: Black stool is. History of present illness. Currently no abdominal pain. Genitourinary: Negative for dysuria. Musculoskeletal: Negative for back pain. Skin: Negative for rash. Neurological: Negative for headache. 10 point Review of Systems otherwise negative ____________________________________________   PHYSICAL  EXAM:  VITAL SIGNS: ED Triage Vitals  Enc Vitals Group     BP --      Pulse Rate 12/31/15 0958 (!) 115     Resp 12/31/15 0958 16     Temp --      Temp src --      SpO2 12/31/15 0958 100 %     Weight 12/31/15 0959 153 lb (69.4 kg)     Height 12/31/15 0959 5\' 8"  (1.727 m)     Head Circumference --      Peak Flow --      Pain Score --      Pain Loc --      Pain Edu? --      Excl. in [place]? --      Constitutional: Alert and oriented. Very pale and has low energy, but no acute distress. HEENT   Head: Normocephalic and atraumatic.      Eyes: Conjunctiva pale. PERRL. Normal extraocular movements.      Ears:         Nose: No congestion/rhinnorhea.   Mouth/Throat: Mucous membranes are mildly dry. Gums and lips are pale   Neck: No stridor. Cardiovascular/Chest: Tachycardic, regular rhythm.  No murmurs, rubs, or gallops. Respiratory: Normal respiratory effort without tachypnea nor retractions. Breath sounds are clear and equal bilaterally. No wheezes/rales/rhonchi. Gastrointestinal: Soft. No distention, no guarding, no rebound. Nontender.    Genitourinary/rectal:  Dried blood at the rectum on external visualization. Musculoskeletal: Nontender with normal range of motion in all extremities. No joint effusions.  No lower  extremity tenderness.  No edema. Neurologic:  Normal speech and language. No gross or focal neurologic deficits are appreciated. Skin:  Skin is warm, dry and intact. No rash noted. Psychiatric: Mood and affect are normal. Speech and behavior are normal. Patient exhibits appropriate insight and judgment.   ____________________________________________  LABS (pertinent positives/negatives)  Labs Reviewed  COMPREHENSIVE METABOLIC PANEL - Abnormal; Notable for the following:       Result Value   CO2 17 (*)    Glucose, Bld 262 (*)    BUN 32 (*)    Calcium 7.8 (*)    Total Protein 4.8 (*)    Albumin 1.9 (*)    ALT 14 (*)    Alkaline Phosphatase 25 (*)     Total Bilirubin 0.2 (*)    All other components within normal limits  CBC WITH DIFFERENTIAL/PLATELET - Abnormal; Notable for the following:    WBC 16.7 (*)    RBC 1.50 (*)    Hemoglobin 4.2 (*)    HCT 13.4 (*)    MCHC 31.4 (*)    Platelets 566 (*)    Neutro Abs 13.1 (*)    All other components within normal limits  LACTIC ACID, PLASMA - Abnormal; Notable for the following:    Lactic Acid, Venous 8.5 (*)    All other components within normal limits  CULTURE, BLOOD (ROUTINE X 2)  CULTURE, BLOOD (ROUTINE X 2)  TROPONIN I  LACTIC ACID, PLASMA  TYPE AND SCREEN  PREPARE RBC (CROSSMATCH)  ABO/RH    ____________________________________________    EKG I, Lisa Roca, MD, the attending physician have personally viewed and interpreted all ECGs.  111bpm  sinus tachycardia. Narrow QRS. Normal axis. Nonspecific ST and T-wave ____________________________________________  RADIOLOGY All Xrays were viewed by me. Imaging interpreted by Radiologist.  None __________________________________________  PROCEDURES  Procedure(s) performed: None  Critical Care performed: CRITICAL CARE Performed by: Lisa Roca   Total critical care time: 60 minutes  Critical care time was exclusive of separately billable procedures and treating other patients.  Critical care was necessary to treat or prevent imminent or life-threatening deterioration.  Critical care was time spent personally by me on the following activities: development of treatment plan with patient and/or surrogate as well as nursing, discussions with consultants, evaluation of patient's response to treatment, examination of patient, obtaining history from patient or surrogate, ordering and performing treatments and interventions, ordering and review of laboratory studies, ordering and review of radiographic studies, pulse oximetry and re-evaluation of patient's condition.   ____________________________________________   ED  COURSE / ASSESSMENT AND PLAN  Pertinent labs & imaging results that were available during my care of the patient were reviewed by me and considered in my medical decision making (see chart for details).   Mark Callahan appears quite pale with a history recently diagnosed anemia, followed by several days now black stools and now syncope in the setting of low blood pressure. 2 L normal saline initiated for IV fluid bolus while blood work is being obtained. Type and screen sent. No active/ongoing pain.  Hypothermia, suspect likely from severe anemia clinically with presumed gi bleed by history.  Placed on Beir hugger.  Sending blood cultures, but feel sepsis less likely clinically - but out of abundance of precaution will cover with broad spectrum antibiotics given hypotension and hypothermia with possibility of sepsis.  {Studies show hemoglobin 4.2. Patient was consented for packed red blood cells and blood transfusion. 3 units of blood was obtained for ER blood transfusion.  After IV fluid bolus, blood pressure above 100.  White blood cell count is elevated. Again, patient was covered with vancomycin and Zosyn. His lactate is significantly elevated at 8. He is receiving IV fluid bolus as well as blood transfusion.  We do not have GI coverage at this hospital today, and so I consulted [place], which was full, but [facility] had space and Dr. Tana Coast accepted in transfer to step down.  I also spoke with Dr. [name], GI [facility], will see patient in consultation.    CONSULTATIONS: Medicine [facility] for transfer.  GI Dr. [name], [facility], will see in consultation.   Patient / Family / Caregiver informed of clinical course, medical decision-making process, and agree with plan.    ___________________________________________   FINAL CLINICAL IMPRESSION(S) / ED DIAGNOSES   Final diagnoses:  Acute GI bleeding  Acute blood loss anemia              Note: This dictation was  prepared with Dragon dictation. Any transcriptional errors that result from this process are unintentional    Lisa Roca, MD 12/31/15 1254

## 2015-12-31 NOTE — ED Notes (Signed)
This RN recollected 2nd set of blood cultures at this time, started 2 peripheral IVs. This RN introduced self to patient and wife at this time. Pt is noted to be pale, slight color noted to his lips. This RN explained that his blood for blood transfusion was ready and that this RN and another RN would in room to begin transfusion, pt and wife state understanding. Pt states that his family has a hx of aneurysms (his grandfather and father) in their abdomens, pt also reports daily use of ASA and Ibuprofen for pain and cardiac reasons. Pt is noted to be responsive to verbal stimulation and is oriented. Pt and wife state understanding at this time. Will continue to monitor for further patient condition.

## 2015-12-31 NOTE — ED Notes (Signed)
Pt transferred to [facility] via [place]. Pt left in care of [facility] at this time. Report called to [facility], Therapist, sports.

## 2015-12-31 NOTE — ED Notes (Signed)
Report Given to Megan, RN.

## 2016-01-01 ENCOUNTER — Encounter (HOSPITAL_COMMUNITY)
Admission: AD | Disposition: A | Discharge status: Home / Self Care | Payer: Self-pay | Source: Other Acute Inpatient Hospital | Attending: Internal Medicine

## 2016-01-01 DIAGNOSIS — I10 Essential (primary) hypertension: Secondary | ICD-10-CM

## 2016-01-01 DIAGNOSIS — E872 Acidosis: Secondary | ICD-10-CM

## 2016-01-01 DIAGNOSIS — D62 Acute posthemorrhagic anemia: Secondary | ICD-10-CM

## 2016-01-01 DIAGNOSIS — K2971 Gastritis, unspecified, with bleeding: Secondary | ICD-10-CM

## 2016-01-01 DIAGNOSIS — E860 Dehydration: Secondary | ICD-10-CM

## 2016-01-01 HISTORY — PX: ESOPHAGOGASTRODUODENOSCOPY: SHX5428

## 2016-01-01 LAB — CBC
HCT: 19.8 % — ABNORMAL LOW (ref 39.0–52.0)
Hemoglobin: 6.7 g/dL — CL (ref 13.0–17.0)
MCH: 29.6 pg (ref 26.0–34.0)
MCHC: 33.8 g/dL (ref 30.0–36.0)
MCV: 87.6 fL (ref 78.0–100.0)
PLATELETS: 266 10*3/uL (ref 150–400)
RBC: 2.26 MIL/uL — ABNORMAL LOW (ref 4.22–5.81)
RDW: 13.8 % (ref 11.5–15.5)
WBC: 10.6 10*3/uL — ABNORMAL HIGH (ref 4.0–10.5)

## 2016-01-01 LAB — BASIC METABOLIC PANEL
Anion gap: 5 (ref 5–15)
BUN: 18 mg/dL (ref 6–20)
CALCIUM: 7.4 mg/dL — AB (ref 8.9–10.3)
CHLORIDE: 115 mmol/L — AB (ref 101–111)
CO2: 22 mmol/L (ref 22–32)
CREATININE: 0.82 mg/dL (ref 0.61–1.24)
GFR calc Af Amer: >60 mL/min (ref >60)
GFR calc non Af Amer: >60 mL/min (ref >60)
GLUCOSE: 109 mg/dL — AB (ref 65–99)
Potassium: 3.8 mmol/L (ref 3.5–5.1)
Sodium: 142 mmol/L (ref 135–145)

## 2016-01-01 LAB — LACTIC ACID, PLASMA: LACTIC ACID, VENOUS: 0.8 mmol/L (ref 0.5–1.9)

## 2016-01-01 LAB — HEMOGLOBIN AND HEMATOCRIT, BLOOD
HCT: 26.7 % — ABNORMAL LOW (ref 39.0–52.0)
HEMATOCRIT: 20.4 % — AB (ref 39.0–52.0)
HEMOGLOBIN: 9.3 g/dL — AB (ref 13.0–17.0)
HEMOGLOBIN: 7 g/dL — AB (ref 13.0–17.0)

## 2016-01-01 LAB — FOLATE: Folate: 19.1 ng/mL (ref >5.9)

## 2016-01-01 LAB — FERRITIN: FERRITIN: 44 ng/mL (ref 24–336)

## 2016-01-01 LAB — PREPARE RBC (CROSSMATCH)

## 2016-01-01 LAB — VITAMIN B12: Vitamin B-12: 1058 pg/mL — ABNORMAL HIGH (ref 180–914)

## 2016-01-01 LAB — IRON AND TIBC
Iron: 104 ug/dL (ref 45–182)
SATURATION RATIOS: 48 % — AB (ref 17.9–39.5)
TIBC: 217 ug/dL — ABNORMAL LOW (ref 250–450)
UIBC: 113 ug/dL

## 2016-01-01 LAB — RETICULOCYTES
RBC.: 2.34 MIL/uL — AB (ref 4.22–5.81)
RETIC CT PCT: 2.6 % (ref 0.4–3.1)
Retic Count, Absolute: 60.8 10*3/uL (ref 19.0–186.0)

## 2016-01-01 SURGERY — EGD (ESOPHAGOGASTRODUODENOSCOPY)
Anesthesia: Moderate Sedation | Laterality: Left

## 2016-01-01 MED ORDER — BUTAMBEN-TETRACAINE-BENZOCAINE 2-2-14 % EX AERO
INHALATION_SPRAY | CUTANEOUS | Status: DC | PRN
  Administered 2016-01-01: 2 via TOPICAL

## 2016-01-01 MED ORDER — MIDAZOLAM HCL 5 MG/ML IJ SOLN
INTRAMUSCULAR | Status: AC
  Filled 2016-01-01: qty 2

## 2016-01-01 MED ORDER — DIPHENHYDRAMINE HCL 50 MG/ML IJ SOLN
INTRAMUSCULAR | Status: DC | PRN
  Administered 2016-01-01: 25 mg via INTRAVENOUS

## 2016-01-01 MED ORDER — SODIUM CHLORIDE 0.9 % IV SOLN: INTRAVENOUS | Status: DC

## 2016-01-01 MED ORDER — FENTANYL CITRATE (PF) 100 MCG/2ML IJ SOLN
INTRAMUSCULAR | Status: DC | PRN
  Administered 2016-01-01 (×2): 25 ug via INTRAVENOUS

## 2016-01-01 MED ORDER — FENTANYL CITRATE (PF) 100 MCG/2ML IJ SOLN
INTRAMUSCULAR | Status: AC
  Filled 2016-01-01: qty 2

## 2016-01-01 MED ORDER — SODIUM CHLORIDE 0.9 % IV SOLN
Freq: Once | INTRAVENOUS | Status: AC
  Administered 2016-01-01: 08:00:00 via INTRAVENOUS

## 2016-01-01 MED ORDER — MIDAZOLAM HCL 10 MG/2ML IJ SOLN
INTRAMUSCULAR | Status: DC | PRN
Start: 2016-01-01 — End: 2016-01-01
  Administered 2016-01-01 (×2): 2 mg via INTRAVENOUS
  Administered 2016-01-01: 1 mg via INTRAVENOUS

## 2016-01-01 MED ORDER — DIPHENHYDRAMINE HCL 50 MG/ML IJ SOLN
INTRAMUSCULAR | Status: AC
  Filled 2016-01-01: qty 1

## 2016-01-01 MED ORDER — LORATADINE 10 MG PO TABS
10.0000 mg | ORAL_TABLET | Freq: Every day | ORAL | Status: DC
  Administered 2016-01-01 – 2016-01-03 (×3): 10 mg via ORAL
  Filled 2016-01-01 (×3): qty 1

## 2016-01-01 NOTE — Progress Notes (Signed)
PROGRESS NOTE    Mark Callahan  [id number] DOB: 10/19/1955 DOA: 12/31/2015 PCP: [name], MD   Brief Narrative: Patient is a 60 year old male with hyperlipidemia who presented to [facility] [place] With rectal bleeding and syncopal episode . Admitted to step down for further evaluation.  Assessment & Plan:   Principal Problem:   Acute blood loss anemia Active Problems:   GI bleed   Hyperlipidemia   Lactic acidosis   Dehydration   Acute blood loss anemia Secondary to GI bleed : Secondary to excessive aspirin and NSAID use, hemoglobin 4.2 with time of admission,  - admit to SDU, received 3 units of prbc transfusion and 4 th unit is running at this time.  - No NSAIDs, placed on PPI drip, GI consulted, EGD today showing  LA Grade A esophagitis. Small hiatal hernia.   Very large but benign-appearing non-bleeding gastric ulcer with pigmented material.  Hematin (altered blood/coffee-ground-like     material) in the gastric body.  One smaller cratered non-bleeding duodenal ulcer with pigmented material.  - Anemia panel show low ferritin levels.  - transfuse to keep a hemoglobin of greater than 8.  - look for H pylori serologies.  - clear liquid diet , and advance as tolerated.  - continue with IV PPI  For anther 24 hours and po PPI BID on discharge.  - GI recommended repeat endoscopy in 2 to 3 months to evaluate healing of the ulcer.       Hyperlipidemia - Continue statin    Lactic acidosis, Dehydration - improved.    DVT prophylaxis: Scd's Code Status: (Full/) Family Communication: none at bedside.  Disposition Plan: pending further evaluation. Transfer to med surg.    Consultants:   Gastroenterology.   Procedures:              EGD           LA Grade A esophagitis.                           - Small hiatal hernia.                           - Very large but benign-appearing non-bleeding                            gastric ulcer with pigmented material.                           - Hematin (altered blood/coffee-ground-like                            material) in the gastric body.                           - One smaller cratered non-bleeding duodenal ulcer                            with pigmented material   Antimicrobials: NONE   Subjective: Back pain.   Objective: Vitals:   01/01/16 1150 01/01/16 1155 01/01/16 1200 01/01/16 1205  BP: 137/79  128/75   Pulse: 89 87 86 82  Resp: 16 16 20 15   Temp:      TempSrc:  SpO2: 99% 100% 100% 100%  Weight:      Height:        Intake/Output Summary (Last 24 hours) at 01/01/16 1207 Last data filed at 01/01/16 1019  Gross per 24 hour  Intake          3623.49 ml  Output             2375 ml  Net          1248.49 ml   Filed Weights   12/31/15 1400  Weight: 72.1 kg (158 lb 15.2 oz)    Examination:  General exam: Appears calm and comfortable  Respiratory system: Clear to auscultation. Respiratory effort normal. Cardiovascular system: S1 & S2 heard, RRR. No JVD, murmurs, rubs, gallops or clicks. No pedal edema. Gastrointestinal system: Abdomen is nondistended, soft and nontender. No organomegaly or masses felt. Normal bowel sounds heard. Central nervous system: Alert and oriented. No focal neurological deficits. Extremities: Symmetric 5 x 5 power. Skin: No rashes, lesions or ulcers Psychiatry: Judgement and insight appear normal. Mood & affect appropriate.     Data Reviewed: I have personally reviewed following labs and imaging studies  CBC:  Recent Labs Lab 12/31/15 1003 12/31/15 1504 01/01/16 0028 01/01/16 0343  WBC 16.7* 14.2*  --  10.6*  NEUTROABS 13.1*  --   --   --   HGB 4.2* 5.3* 7.0* 6.7*  HCT 13.4* 15.8* 20.4* 19.8*  MCV 89.3 87.8  --  87.6  PLT 566* 351  --  123456   Basic Metabolic Panel:  Recent Labs Lab 12/31/15 1003 01/01/16 0343  NA 139 142  K 3.8 3.8  CL 108 115*  CO2 17* 22  GLUCOSE 262* 109*  BUN 32* 18  CREATININE 1.11 0.82  CALCIUM 7.8*  7.4*   GFR: Estimated Creatinine Clearance: 90.4 mL/min (by C-G formula based on SCr of 0.82 mg/dL). Liver Function Tests:  Recent Labs Lab 12/31/15 1003  AST 25  ALT 14*  ALKPHOS 25*  BILITOT 0.2*  PROT 4.8*  ALBUMIN 1.9*   No results for input(s): LIPASE, AMYLASE in the last 168 hours. No results for input(s): AMMONIA in the last 168 hours. Coagulation Profile: No results for input(s): INR, PROTIME in the last 168 hours. Cardiac Enzymes:  Recent Labs Lab 12/31/15 1003  TROPONINI <0.03   BNP (last 3 results) No results for input(s): PROBNP in the last 8760 hours. HbA1C: No results for input(s): HGBA1C in the last 72 hours. CBG: No results for input(s): GLUCAP in the last 168 hours. Lipid Profile: No results for input(s): CHOL, HDL, LDLCALC, TRIG, CHOLHDL, LDLDIRECT in the last 72 hours. Thyroid Function Tests: No results for input(s): TSH, T4TOTAL, FREET4, T3FREE, THYROIDAB in the last 72 hours. Anemia Panel:  Recent Labs  01/01/16 0028  VITAMINB12 1,058*  FOLATE 19.1  FERRITIN 44  TIBC 217*  IRON 104  RETICCTPCT 2.6   Sepsis Labs:  Recent Labs Lab 12/31/15 1003 01/01/16 0343  LATICACIDVEN 8.5* 0.8    Recent Results (from the past 240 hour(s))  Culture, blood (routine x 2)     Status: None (Preliminary result)   Collection Time: 12/31/15 10:04 AM  Result Value Ref Range Status   Specimen Description BLOOD LEFT FA  Final   Special Requests   Final    BOTTLES DRAWN AEROBIC AND ANAEROBIC AER 11ML ANA 14ML   Culture NO GROWTH < 24 HOURS  Final   Report Status PENDING  Incomplete  Culture, blood (routine x 2)  Status: None (Preliminary result)   Collection Time: 12/31/15 11:40 AM  Result Value Ref Range Status   Specimen Description BLOOD RIGHT FA  Final   Special Requests   Final    BOTTLES DRAWN AEROBIC AND ANAEROBIC AER 10ML ANA 12ML   Culture NO GROWTH < 24 HOURS  Final   Report Status PENDING  Incomplete  MRSA PCR Screening     Status:  None   Collection Time: 12/31/15  1:48 PM  Result Value Ref Range Status   MRSA by PCR NEGATIVE NEGATIVE Final    Comment:        The GeneXpert MRSA Assay (FDA approved for NASAL specimens only), is one component of a comprehensive MRSA colonization surveillance program. It is not intended to diagnose MRSA infection nor to guide or monitor treatment for MRSA infections.          Radiology Studies: Dg Chest Port 1 View  Result Date: 12/31/2015 CLINICAL DATA:  Rectal bleeding. EXAM: PORTABLE CHEST 1 VIEW COMPARISON:  CT 12/08/2015 . FINDINGS: Mediastinum and hilar structures normal. Cardiomegaly with normal pulmonary vascularity. No focal infiltrate. No pleural effusion or pneumothorax. Prior cervical spine fusion. IMPRESSION: No acute cardiopulmonary disease. Electronically Signed   By: Marcello Moores  Register   On: 12/31/2015 10:33        Scheduled Meds: . [MAR Hold] atorvastatin  40 mg Oral QPM  . [MAR Hold] cholecalciferol  1,000 Units Oral Daily  . [MAR Hold] feeding supplement  1 Container Oral TID BM  . [MAR Hold] mouth rinse  15 mL Mouth Rinse BID  . [MAR Hold] pantoprazole  40 mg Intravenous Q12H  . [MAR Hold] sodium chloride flush  3 mL Intravenous Q12H   Continuous Infusions: . sodium chloride 100 mL/hr at 01/01/16 1000  . sodium chloride Stopped (01/01/16 0730)  . pantoprozole (PROTONIX) infusion 8 mg/hr (01/01/16 1000)     LOS: 1 day    Time spent: 85 minutes    Hiro Vipond, MD [facility] Pager [phone]  If 7PM-7AM, please contact night-coverage [email or web address] Password [facility] 01/01/2016, 12:07 PM

## 2016-01-01 NOTE — Op Note (Signed)
[facility] Patient Name: Mark Callahan Procedure Date: 01/01/2016 MRN: [id number] Attending MD: Arta Silence , MD Date of Birth: 13-Mar-1955 CSN: [id number] Age: 60 Admit Type: Inpatient Procedure:                Upper GI endoscopy Indications:              Epigastric abdominal pain, Acute post hemorrhagic                            anemia, Melena Providers:                Arta Silence, MD, Zenon Mayo, RN, Ralene Bathe,                            Technician Referring MD:             Estill Cotta, MD [facility]) Medicines:                Fentanyl 50 micrograms IV, Midazolam 5 mg IV,                            Diphenhydramine 25 mg IV Complications:            No immediate complications. Estimated Blood Loss:     Estimated blood loss was minimal. Procedure:                Pre-Anesthesia Assessment:                           - Prior to the procedure, a History and Physical                            was performed, and patient medications and                            allergies were reviewed. The patient's tolerance of                            previous anesthesia was also reviewed. The risks                            and benefits of the procedure and the sedation                            options and risks were discussed with the patient.                            All questions were answered, and informed consent                            was obtained. Prior Anticoagulants: The patient has                            taken aspirin. ASA Grade Assessment: III - A  patient with severe systemic disease. After                            reviewing the risks and benefits, the patient was                            deemed in satisfactory condition to undergo the                            procedure.                           After obtaining informed consent, the endoscope was                            passed under direct vision. Throughout the                        procedure, the patient's blood pressure, pulse, and                            oxygen saturations were monitored continuously. The                            Endoscope was introduced through the mouth, and                            advanced to the second part of duodenum. The upper                            GI endoscopy was accomplished without difficulty.                            The patient tolerated the procedure well. Scope In: Scope Out: Findings:      LA Grade A (one or more mucosal breaks less than 5 mm, not extending       between tops of 2 mucosal folds) esophagitis was found.      A small hiatal hernia was present.      The exam of the esophagus was otherwise normal.      One very large and very deep non-bleeding cratered gastric ulcer with       pigmented material was found in the gastric body and in the gastric       antrum. Biopsies were not obtained, given the depth of the lesion, and       recent bleeding, for fear of precipitating bleeding or inciting       perforation. The lesion was 40 mm x 50 mm in largest dimension. Margins       are clean-based and endoscopically ulcer appears benign. No active       source of bleeding identified.      Hematin (altered blood/coffee-ground-like material) was found in the       gastric body. Estimated blood loss was minimal.      The exam of the stomach was otherwise normal.      One non-bleeding cratered duodenal ulcer with pigmented material was  found in the duodenal bulb. The lesion was 15 mm in largest dimension.      The exam of the duodenum was otherwise normal. Impression:               - LA Grade A esophagitis.                           - Small hiatal hernia.                           - Very large but benign-appearing non-bleeding                            gastric ulcer with pigmented material.                           - Hematin (altered blood/coffee-ground-like                             material) in the gastric body.                           - One smaller cratered non-bleeding duodenal ulcer                            with pigmented material. Moderate Sedation:      Moderate (conscious) sedation was administered by the endoscopy nurse       and supervised by the endoscopist. The following parameters were       monitored: oxygen saturation, heart rate, blood pressure, and response       to care. Recommendation:           - Return patient to hospital ward for ongoing care.                           - Full liquid diet today.                           - Follow CBCs and transfuse as needed to Hgb goal                            >/= 8.                           - Continue present medications; PPI infusion for                            another 24 hours, then transition to BID oral                            formulation over the next 1-2 days.                           - No ASA or NSAIDs indefinitely, unless otherwise                            specified.                           -  Check H. pylori serologies.                           [facility] will follow.                           - Will need repeat endoscopy as outpatient in 2-3                            months to assess for ulcer healing and                            consideration of biopsies as appropriate. Procedure Code(s):        --- Professional ---                           [id number], Esophagogastroduodenoscopy, flexible,                            transoral; diagnostic, including collection of                            specimen(s) by brushing or washing, when performed                            (separate procedure) Diagnosis Code(s):        --- Professional ---                           K20.9, Esophagitis, unspecified                           K44.9, Diaphragmatic hernia without obstruction or                            gangrene                           K25.9, Gastric ulcer, unspecified as acute or                             chronic, without hemorrhage or perforation                           K92.2, Gastrointestinal hemorrhage, unspecified                           K26.9, Duodenal ulcer, unspecified as acute or                            chronic, without hemorrhage or perforation                           R10.13, Epigastric pain                           D62, Acute posthemorrhagic anemia  K92.1, Melena (includes Hematochezia) CPT copyright 2016 [employer]. All rights reserved. The codes documented in this report are preliminary and upon coder review may  be revised to meet current compliance requirements. Arta Silence, MD 01/01/2016 11:53:56 AM This report has been signed electronically. Number of Addenda: 0

## 2016-01-01 NOTE — H&P (View-Only) (Signed)
[facility] Gastroenterology Consultation Note  Referring Provider:  Dr. Estill Cotta [facility]) Primary Care Physician:  [name], MD  Reason for Consultation:  Melena, anemia  HPI: Mark Callahan is a 60 y.o. male admitted for above reasons.  Patient had syncopal episode today, found to have Hgb 4.2.  Has had few days' of of melena.  Chronic occasional blood on tissue paper with passage of hard stools in setting of chronic narcotic use for back pain.  Has had escalating back pain recently, and has been taking several aspirin per day and a few acetaminophen per day as well.  No hematemesis.  22-month history of intermittent sharp epigastric pain, worse with movement and sometimes worse after eating.  Colonoscopy in [place] about 10 years ago, normal per patient recollection.  No prior endoscopy.   Past Medical History:  Diagnosis Date  . Cancer ([place])    skin ca  . High cholesterol     Past Surgical History:  Procedure Laterality Date  . NECK SURGERY      Prior to Admission medications   Medication Sig Start Date End Date Taking? Authorizing Provider  acetaminophen (TYLENOL) 500 MG tablet Take 1,000 mg by mouth every 6 (six) hours as needed for moderate pain.   Yes Historical Provider, MD  aspirin 325 MG tablet Take 650 mg by mouth 3 (three) times daily as needed for moderate pain or headache.   Yes Historical Provider, MD  aspirin EC 81 MG tablet Take 81 mg by mouth daily.   Yes Historical Provider, MD  atorvastatin (LIPITOR) 40 MG tablet Take 40 mg by mouth every evening.  06/04/15  Yes Historical Provider, MD  B Complex-Biotin-FA (B-COMPLEX PO) Take 1 tablet by mouth daily.   Yes Historical Provider, MD  Cholecalciferol (VITAMIN D3) 1000 units CAPS Take 1,000 Units by mouth daily.   Yes Historical Provider, MD  Cyanocobalamin (B-12 PO) Take 1 tablet by mouth daily with lunch.    Yes Historical Provider, MD  diazepam (VALIUM) 5 MG tablet Take 5 mg by mouth every 12 (twelve) hours as  needed for anxiety. 12/07/15  Yes Historical Provider, MD  diclofenac (VOLTAREN) 75 MG EC tablet Take 75 mg by mouth 2 (two) times daily. inflammation 06/04/15  Yes Historical Provider, MD  DOCOSAHEXAENOIC ACID PO Take 1 g by mouth 2 (two) times daily.   Yes Historical Provider, MD  Ferrous Sulfate (IRON) 325 (65 Fe) MG TABS Take 162.5 mg by mouth daily.   Yes Historical Provider, MD  HYDROcodone-acetaminophen (NORCO) 10-325 MG tablet Take 1 tablet by mouth every 8 (eight) hours as needed for pain. 02/01/16  Yes Historical Provider, MD  Multiple Vitamin (MULTI-VITAMINS) TABS Take 1 tablet by mouth daily.   Yes Historical Provider, MD  oxyCODONE-acetaminophen (PERCOCET) 10-325 MG tablet Take 1 tablet by mouth every 8 (eight) hours as needed for pain. 12/23/15  Yes Historical Provider, MD  pyridOXINE (B-6) 50 MG tablet Take 100 mg by mouth every evening.    Yes Historical Provider, MD  predniSONE (DELTASONE) 10 MG tablet Take 10-40 mg by mouth as directed. Take 40mg  for 2 days, then 30mg  for 2 days, then 20mg  for 2 days and then 10mg  for 2 days, completed course around 12/31/15 12/23/15 01/02/16  Historical Provider, MD    Current Facility-Administered Medications  Medication Dose Route Frequency Provider Last Rate Last Dose  . 0.9 %  sodium chloride infusion   Intravenous Continuous Ripudeep Krystal Eaton, MD      .  0.9 %  sodium chloride infusion   Intravenous Once Ripudeep K Rai, MD      . acetaminophen (TYLENOL) tablet 650 mg  650 mg Oral Q6H PRN Ripudeep Krystal Eaton, MD       Or  . acetaminophen (TYLENOL) suppository 650 mg  650 mg Rectal Q6H PRN Ripudeep K Rai, MD      . atorvastatin (LIPITOR) tablet 40 mg  40 mg Oral QPM Ripudeep K Rai, MD      . cholecalciferol (VITAMIN D) tablet 1,000 Units  1,000 Units Oral Daily Ripudeep K Rai, MD      . HYDROcodone-acetaminophen (NORCO) 10-325 MG per tablet 1 tablet  1 tablet Oral Q4H PRN Ripudeep K Rai, MD      . MEDLINE mouth rinse  15 mL Mouth Rinse BID Ripudeep K  Rai, MD      . ondansetron (ZOFRAN) tablet 4 mg  4 mg Oral Q6H PRN Ripudeep Krystal Eaton, MD       Or  . ondansetron (ZOFRAN) injection 4 mg  4 mg Intravenous Q6H PRN Ripudeep K Rai, MD      . pantoprazole (PROTONIX) 80 mg in sodium chloride 0.9 % 100 mL IVPB  80 mg Intravenous Once Ripudeep K Rai, MD      . pantoprazole (PROTONIX) 80 mg in sodium chloride 0.9 % 250 mL (0.32 mg/mL) infusion  8 mg/hr Intravenous Continuous Ripudeep K Rai, MD      . Derrill Memo ON 01/04/2016] pantoprazole (PROTONIX) injection 40 mg  40 mg Intravenous Q12H Ripudeep K Rai, MD      . sodium chloride flush (NS) 0.9 % injection 3 mL  3 mL Intravenous Q12H Ripudeep Krystal Eaton, MD        Allergies as of 12/31/2015  . (No Known Allergies)    No family history on file.  Social History   Social History  . Marital status: Married    Spouse name: N/A  . Number of children: N/A  . Years of education: N/A   Occupational History  . Not on file.   Social History Main Topics  . Smoking status: Not on file  . Smokeless tobacco: Not on file  . Alcohol use Not on file  . Drug use: Unknown  . Sexual activity: Not on file   Other Topics Concern  . Not on file   Social History Narrative  . No narrative on file    Review of Systems: Positive = bold Gen: Denies any fever, chills, rigors, night sweats, anorexia, fatigue, weakness, malaise, involuntary weight loss, and sleep disorder CV: Denies chest pain, angina, palpitations, syncope, orthopnea, PND, peripheral edema, and claudication. Resp: Denies dyspnea, cough, sputum, wheezing, coughing up blood. GI: Described in detail in HPI.    GU : Denies urinary burning, blood in urine, urinary frequency, urinary hesitancy, nocturnal urination, and urinary incontinence. MS: Denies back pain or swelling.  Denies muscle weakness, cramps, atrophy.  Derm: Denies rash, itching, oral ulcerations, hives, unhealing ulcers.  Psych: Denies depression, anxiety, memory loss, suicidal ideation,  hallucinations,  and confusion. Heme: Denies bruising, bleeding, and enlarged lymph nodes. Neuro:  Denies any headaches, dizziness, paresthesias. Endo:  Denies any problems with DM, thyroid, adrenal function.  Physical Exam: Vital signs in last 24 hours: Temp:  [95.8 F (35.4 C)-99.6 F (37.6 C)] 98.4 F (36.9 C) (12/07 1400) Pulse Rate:  [102-122] 112 (12/07 1249) Resp:  [14-21] 19 (12/07 1400) BP: (100-119)/(52-89) 119/62 (12/07 1400) SpO2:  [99 %-100 %] 100 % (  12/07 1249) Weight:  [69.4 kg (153 lb)-72.1 kg (158 lb 15.2 oz)] 72.1 kg (158 lb 15.2 oz) (12/07 1400)   General:   Alert,  Well-developed, well-nourished, pleasant and cooperative in NAD Head:  Normocephalic and atraumatic. Eyes:  Sclera clear, no icterus.   Conjunctiva pale Ears:  Normal auditory acuity. Nose:  No deformity, discharge,  or lesions. Mouth:  No deformity or lesions.  Oropharynx dry and pale Neck:  Supple; no masses or thyromegaly. Lungs:  Clear throughout to auscultation.   No wheezes, crackles, or rhonchi. No acute distress. Heart:  Regular rate and rhythm; no murmurs, clicks, rubs,  or gallops. Abdomen:  Soft, mild epigastric tenderness without peritonitis, nondistended. No masses, hepatosplenomegaly or hernias noted. Normal bowel sounds, without guarding, and without rebound.     Msk:  Symmetrical without gross deformities. Normal posture. Pulses:  Normal pulses noted. Extremities:  Without clubbing or edema. Neurologic:  Alert and  oriented x4; diffusely weak, otherwise grossly normal neurologically. Skin:  Markedly pale; otherwise intact without significant lesions or rashes. Psych:  Alert and cooperative. Normal mood and affect.   Lab Results:  Recent Labs  12/31/15 1003  WBC 16.7*  HGB 4.2*  HCT 13.4*  PLT 566*   BMET  Recent Labs  12/31/15 1003  NA 139  K 3.8  CL 108  CO2 17*  GLUCOSE 262*  BUN 32*  CREATININE 1.11  CALCIUM 7.8*   LFT  Recent Labs  12/31/15 1003  PROT  4.8*  ALBUMIN 1.9*  AST 25  ALT 14*  ALKPHOS 25*  BILITOT 0.2*   PT/INR No results for input(s): LABPROT, INR in the last 72 hours.  Studies/Results: Dg Chest Port 1 View  Result Date: 12/31/2015 CLINICAL DATA:  Rectal bleeding. EXAM: PORTABLE CHEST 1 VIEW COMPARISON:  CT 12/08/2015 . FINDINGS: Mediastinum and hilar structures normal. Cardiomegaly with normal pulmonary vascularity. No focal infiltrate. No pleural effusion or pneumothorax. Prior cervical spine fusion. IMPRESSION: No acute cardiopulmonary disease. Electronically Signed   By: Marcello Moores  Register   On: 12/31/2015 10:33   Impression:  1.  Melena. Non-destabilizing. 2.  Acute blood loss anemia. 3.  Epigastric pain, progressive for several months. 4.  Weakness and dehydration.  Plan:  1.  Volume repletion. 2.  Blood transfusion, Hgb goal >/= 8. 3.  PPI drip. 4.  Endoscopy tomorrow. 5.  Risks (bleeding, infection, bowel perforation that could require surgery, sedation-related changes in cardiopulmonary systems), benefits (identification and possible treatment of source of symptoms, exclusion of certain causes of symptoms), and alternatives (watchful waiting, radiographic imaging studies, empiric medical treatment) of upper endoscopy (EGD) were explained to patient/family in detail and patient wishes to proceed. 6.  Next step in management pending EGD findings.  7.  [facility] will follow; case discussed with Dr. Tana Coast of [facility] team.   LOS: 0 days   [name]  12/31/2015, 3:01 PM  Pager [phone] If no answer or after 5 PM call [phone]

## 2016-01-01 NOTE — Progress Notes (Signed)
CRITICAL VALUE ALERT  Critical value received:  hgb 6.7   Date of notification:  01/01/2016  Time of notification:  [id number]  Critical value read back:Yes.    Nurse who received alert:  Isabelle Course RN   MD notified (1st page):  K.KIRBY NP   Time of first page:  0411  MD notified (2nd page):  K.KIRBY NP Time of second page:  0630  Responding MD:    Time MD responded:

## 2016-01-01 NOTE — Interval H&P Note (Signed)
History and Physical Interval Note:  01/01/2016 11:13 AM  Mark Callahan  has presented today for surgery, with the diagnosis of melena, acute blood loss anemia  The various methods of treatment have been discussed with the patient and family. After consideration of risks, benefits and other options for treatment, the patient has consented to  Procedure(s): ESOPHAGOGASTRODUODENOSCOPY (EGD) (Left) as a surgical intervention .  The patient's history has been reviewed, patient examined, no change in status, stable for surgery.  I have reviewed the patient's chart and labs.  Questions were answered to the patient's satisfaction.     [name]

## 2016-01-02 LAB — TYPE AND SCREEN
ABO/RH(D): A POS
Antibody Screen: NEGATIVE
UNIT DIVISION: 0
UNIT DIVISION: 0
Unit division: 0

## 2016-01-02 LAB — BASIC METABOLIC PANEL
Anion gap: 4 — ABNORMAL LOW (ref 5–15)
BUN: 7 mg/dL (ref 6–20)
CALCIUM: 7.5 mg/dL — AB (ref 8.9–10.3)
CO2: 24 mmol/L (ref 22–32)
Chloride: 114 mmol/L — ABNORMAL HIGH (ref 101–111)
Creatinine, Ser: 0.77 mg/dL (ref 0.61–1.24)
GFR calc Af Amer: >60 mL/min (ref >60)
GLUCOSE: 103 mg/dL — AB (ref 65–99)
Potassium: 2.9 mmol/L — ABNORMAL LOW (ref 3.5–5.1)
Sodium: 142 mmol/L (ref 135–145)

## 2016-01-02 LAB — HEMOGLOBIN AND HEMATOCRIT, BLOOD
HCT: 27.5 % — ABNORMAL LOW (ref 39.0–52.0)
HEMATOCRIT: 25.7 % — AB (ref 39.0–52.0)
HEMOGLOBIN: 9.1 g/dL — AB (ref 13.0–17.0)
Hemoglobin: 9.4 g/dL — ABNORMAL LOW (ref 13.0–17.0)

## 2016-01-02 LAB — PREPARE RBC (CROSSMATCH)

## 2016-01-02 LAB — H. PYLORI ANTIBODY, IGG: H Pylori IgG: <0.9 U/mL (ref 0.0–0.8)

## 2016-01-02 LAB — HEMOGLOBIN A1C
HEMOGLOBIN A1C: 5.9 % — AB (ref 4.8–5.6)
Mean Plasma Glucose: 123 mg/dL

## 2016-01-02 LAB — MAGNESIUM: MAGNESIUM: 1.9 mg/dL (ref 1.7–2.4)

## 2016-01-02 MED ORDER — POTASSIUM CHLORIDE 2 MEQ/ML IV SOLN
30.0000 meq | Freq: Once | INTRAVENOUS | Status: AC
  Administered 2016-01-02: 30 meq via INTRAVENOUS
  Filled 2016-01-02: qty 15

## 2016-01-02 MED ORDER — POTASSIUM CHLORIDE CRYS ER 20 MEQ PO TBCR
40.0000 meq | EXTENDED_RELEASE_TABLET | Freq: Two times a day (BID) | ORAL | Status: AC
  Administered 2016-01-02 (×2): 40 meq via ORAL
  Filled 2016-01-02 (×2): qty 2

## 2016-01-02 MED ORDER — SUCRALFATE 1 GM/10ML PO SUSP
1.0000 g | Freq: Three times a day (TID) | ORAL | Status: DC
  Administered 2016-01-02 – 2016-01-03 (×2): 1 g via ORAL
  Filled 2016-01-02 (×2): qty 10

## 2016-01-02 MED ORDER — PANTOPRAZOLE SODIUM 40 MG IV SOLR
40.0000 mg | Freq: Two times a day (BID) | INTRAVENOUS | Status: DC
  Administered 2016-01-02 – 2016-01-03 (×3): 40 mg via INTRAVENOUS
  Filled 2016-01-02 (×3): qty 40

## 2016-01-02 NOTE — Progress Notes (Addendum)
PROGRESS NOTE    MARCELINO PELLECCHIA  [id number] DOB: Jun 15, 1955 DOA: 12/31/2015 PCP: [name], MD   Brief Narrative: Patient is a 60 year old male with hyperlipidemia who presented to [facility] [place] With rectal bleeding and syncopal episode . Admitted to step down for further evaluation.  Assessment & Plan:   Principal Problem:   Acute blood loss anemia Active Problems:   GI bleed   Hyperlipidemia   Lactic acidosis   Dehydration   Acute blood loss anemia Secondary to GI bleed : Secondary to excessive aspirin and NSAID use, hemoglobin 4.2 with time of admission,  - admitted to SDU, received 4 units of prbc transfusion - No NSAIDs, placed on PPI drip, GI consulted, EGD today showing  LA Grade A esophagitis. Small hiatal hernia.   Very large but benign-appearing non-bleeding gastric ulcer with pigmented material.  Hematin (altered blood/coffee-ground-like     material) in the gastric body.  One smaller cratered non-bleeding duodenal ulcer with pigmented material.  - Anemia panel show low ferritin levels.  - transfuse to keep a hemoglobin of greater than 8.  - look for H pylori serologies.  - clear liquid diet , and advance as tolerated. Currently on full liquid, plan to advance to soft tonight.  -  po PPI BID on discharge.  - GI recommended repeat endoscopy in 2 to 3 months to evaluate healing of the ulcer.   Hypokalemia: repleted, repeat K in am. Magnesium levels are normal.     Hyperlipidemia - Continue statin    Lactic acidosis, Dehydration - improved.    DVT prophylaxis: Scd's Code Status: (Full/) Family Communication: wife at bedside.  Disposition Plan: d/c in am.    Consultants:   Gastroenterology.   Procedures:              EGD           LA Grade A esophagitis.                           - Small hiatal hernia.                           - Very large but benign-appearing non-bleeding                            gastric ulcer with pigmented  material.                           - Hematin (altered blood/coffee-ground-like                            material) in the gastric body.                           - One smaller cratered non-bleeding duodenal ulcer                            with pigmented material   Antimicrobials: NONE   Subjective: Back pain improved.  No new complaints.   Objective: Vitals:   01/01/16 1524 01/01/16 2124 01/02/16 0526 01/02/16 1323  BP: (!) 142/86 (!) 142/79 139/87 (!) 145/85  Pulse: 94 89 95 86  Resp: 16 16 16 16   Temp:  98.5 F (36.9 C) 98.7 F (37.1 C) 98.4 F (36.9 C) 97.9 F (36.6 C)  TempSrc: Oral Oral Oral Oral  SpO2: 97% 100% 99% 100%  Weight:      Height:       No intake or output data in the 24 hours ending 01/02/16 1711 Filed Weights   12/31/15 1400  Weight: 72.1 kg (158 lb 15.2 oz)    Examination:  General exam: Appears calm and comfortable  Respiratory system: Clear to auscultation. Respiratory effort normal. Cardiovascular system: S1 & S2 heard, RRR. No JVD, murmurs, rubs, gallops or clicks. No pedal edema. Gastrointestinal system: Abdomen is nondistended, soft and nontender. No organomegaly or masses felt. Normal bowel sounds heard. Central nervous system: Alert and oriented. No focal neurological deficits. Extremities: Symmetric 5 x 5 power. Skin: No rashes, lesions or ulcers Psychiatry: Judgement and insight appear normal. Mood & affect appropriate.     Data Reviewed: I have personally reviewed following labs and imaging studies  CBC:  Recent Labs Lab 12/31/15 1003 12/31/15 1504 01/01/16 0028 01/01/16 0343 01/01/16 1718 01/02/16 0101 01/02/16 0824  WBC 16.7* 14.2*  --  10.6*  --   --   --   NEUTROABS 13.1*  --   --   --   --   --   --   HGB 4.2* 5.3* 7.0* 6.7* 9.3* 9.1* 9.4*  HCT 13.4* 15.8* 20.4* 19.8* 26.7* 25.7* 27.5*  MCV 89.3 87.8  --  87.6  --   --   --   PLT 566* 351  --  266  --   --   --    Basic Metabolic Panel:  Recent Labs Lab  12/31/15 1003 01/01/16 0343 01/02/16 0101 01/02/16 0824  NA 139 142 142  --   K 3.8 3.8 2.9*  --   CL 108 115* 114*  --   CO2 17* 22 24  --   GLUCOSE 262* 109* 103*  --   BUN 32* 18 7  --   CREATININE 1.11 0.82 0.77  --   CALCIUM 7.8* 7.4* 7.5*  --   MG  --   --   --  1.9   GFR: Estimated Creatinine Clearance: 92.6 mL/min (by C-G formula based on SCr of 0.77 mg/dL). Liver Function Tests:  Recent Labs Lab 12/31/15 1003  AST 25  ALT 14*  ALKPHOS 25*  BILITOT 0.2*  PROT 4.8*  ALBUMIN 1.9*   No results for input(s): LIPASE, AMYLASE in the last 168 hours. No results for input(s): AMMONIA in the last 168 hours. Coagulation Profile: No results for input(s): INR, PROTIME in the last 168 hours. Cardiac Enzymes:  Recent Labs Lab 12/31/15 1003  TROPONINI <0.03   BNP (last 3 results) No results for input(s): PROBNP in the last 8760 hours. HbA1C:  Recent Labs  01/01/16 0028  HGBA1C 5.9*   CBG: No results for input(s): GLUCAP in the last 168 hours. Lipid Profile: No results for input(s): CHOL, HDL, LDLCALC, TRIG, CHOLHDL, LDLDIRECT in the last 72 hours. Thyroid Function Tests: No results for input(s): TSH, T4TOTAL, FREET4, T3FREE, THYROIDAB in the last 72 hours. Anemia Panel:  Recent Labs  01/01/16 0028  VITAMINB12 1,058*  FOLATE 19.1  FERRITIN 44  TIBC 217*  IRON 104  RETICCTPCT 2.6   Sepsis Labs:  Recent Labs Lab 12/31/15 1003 01/01/16 0343  LATICACIDVEN 8.5* 0.8    Recent Results (from the past 240 hour(s))  Culture, blood (routine x 2)     Status:  None (Preliminary result)   Collection Time: 12/31/15 10:04 AM  Result Value Ref Range Status   Specimen Description BLOOD LEFT FA  Final   Special Requests   Final    BOTTLES DRAWN AEROBIC AND ANAEROBIC AER 11ML ANA 14ML   Culture NO GROWTH 2 DAYS  Final   Report Status PENDING  Incomplete  Culture, blood (routine x 2)     Status: None (Preliminary result)   Collection Time: 12/31/15 11:40 AM    Result Value Ref Range Status   Specimen Description BLOOD RIGHT FA  Final   Special Requests   Final    BOTTLES DRAWN AEROBIC AND ANAEROBIC AER 10ML ANA 12ML   Culture NO GROWTH 2 DAYS  Final   Report Status PENDING  Incomplete  MRSA PCR Screening     Status: None   Collection Time: 12/31/15  1:48 PM  Result Value Ref Range Status   MRSA by PCR NEGATIVE NEGATIVE Final    Comment:        The GeneXpert MRSA Assay (FDA approved for NASAL specimens only), is one component of a comprehensive MRSA colonization surveillance program. It is not intended to diagnose MRSA infection nor to guide or monitor treatment for MRSA infections.          Radiology Studies: No results found.      Scheduled Meds: . atorvastatin  40 mg Oral QPM  . cholecalciferol  1,000 Units Oral Daily  . feeding supplement  1 Container Oral TID BM  . loratadine  10 mg Oral Daily  . mouth rinse  15 mL Mouth Rinse BID  . pantoprazole  40 mg Intravenous Q12H  . potassium chloride  40 mEq Oral BID  . sodium chloride flush  3 mL Intravenous Q12H   Continuous Infusions: . sodium chloride 100 mL/hr at 01/02/16 0514     LOS: 2 days    Time spent: 47 minutes    Kiyomi Pallo, MD [facility] Pager [phone]  If 7PM-7AM, please contact night-coverage [email or web address] Password [email or web address] 01/02/2016, 5:11 PM

## 2016-01-02 NOTE — Progress Notes (Signed)
[facility] Progress Note  Mark Callahan 60 y.o. 1955/04/08  CC:  Epigastric pain, GI bleed   Subjective: Patient's abdominal pain is resolved. Bleeding is improving. Hemoglobin stable. Tolerating diet. Complaining of back pain.  ROS : Gait of abdominal pain, nausea, vomiting   Objective: Vital signs in last 24 hours: Vitals:   01/01/16 2124 01/02/16 0526  BP: (!) 142/79 139/87  Pulse: 89 95  Resp: 16 16  Temp: 98.7 F (37.1 C) 98.4 F (36.9 C)    Physical Exam:  General:  Alert, cooperative, no distress, appears stated age  Head:  Normocephalic, without obvious abnormality, atraumatic  Eyes:  , EOM's intact,   Lungs:   Clear to auscultation bilaterally, respirations unlabored  Heart:  Regular rate and rhythm, S1, S2 normal  Abdomen:   Soft, non-tender, bowel sounds active all four quadrants,  no masses,   Extremities: Extremities normal, atraumatic, no  edema  Pulses: 2+ and symmetric    Lab Results:  Recent Labs  01/01/16 0343 01/02/16 0101  NA 142 142  K 3.8 2.9*  CL 115* 114*  CO2 22 24  GLUCOSE 109* 103*  BUN 18 7  CREATININE 0.82 0.77  CALCIUM 7.4* 7.5*    Recent Labs  12/31/15 1003  AST 25  ALT 14*  ALKPHOS 25*  BILITOT 0.2*  PROT 4.8*  ALBUMIN 1.9*    Recent Labs  12/31/15 1003 12/31/15 1504  01/01/16 0343 01/01/16 1718 01/02/16 0101  WBC 16.7* 14.2*  --  10.6*  --   --   NEUTROABS 13.1*  --   --   --   --   --   HGB 4.2* 5.3*  < > 6.7* 9.3* 9.1*  HCT 13.4* 15.8*  < > 19.8* 26.7* 25.7*  MCV 89.3 87.8  --  87.6  --   --   PLT 566* 351  --  266  --   --   < > = values in this interval not displayed. No results for input(s): LABPROT, INR in the last 72 hours.    Assessment/Plan: - Melena with EGD showing large gastric ulcer as well as small duodenal ulcer. Clean base without any active bleeding. - Blood loss anemia. Hemoglobin stable. - Epigastric pain. Resolved.  Recommendations ------------------------- - H  Pylori serology negative. Patient with negative CT PE as well as CT abdomen with contrast in November 2017. - Change Protonix drip to IV twice a day PPI for today. -Monitor  hemoglobin. Continue current diet for today - GI will follow  Otis Brace MD, FACP 01/02/2016, 8:42 AM  Pager [phone]  If no answer or after 5 PM call [phone]

## 2016-01-03 LAB — BASIC METABOLIC PANEL
ANION GAP: 8 (ref 5–15)
BUN: 6 mg/dL (ref 6–20)
CHLORIDE: 105 mmol/L (ref 101–111)
CO2: 25 mmol/L (ref 22–32)
Calcium: 8.4 mg/dL — ABNORMAL LOW (ref 8.9–10.3)
Creatinine, Ser: 0.82 mg/dL (ref 0.61–1.24)
GFR calc non Af Amer: >60 mL/min (ref >60)
Glucose, Bld: 112 mg/dL — ABNORMAL HIGH (ref 65–99)
POTASSIUM: 3.7 mmol/L (ref 3.5–5.1)
Sodium: 138 mmol/L (ref 135–145)

## 2016-01-03 LAB — CBC
HEMATOCRIT: 30.5 % — AB (ref 39.0–52.0)
HEMOGLOBIN: 10.4 g/dL — AB (ref 13.0–17.0)
MCH: 29.5 pg (ref 26.0–34.0)
MCHC: 34.1 g/dL (ref 30.0–36.0)
MCV: 86.6 fL (ref 78.0–100.0)
Platelets: 387 10*3/uL (ref 150–400)
RBC: 3.52 MIL/uL — ABNORMAL LOW (ref 4.22–5.81)
RDW: 15.3 % (ref 11.5–15.5)
WBC: 9.5 10*3/uL (ref 4.0–10.5)

## 2016-01-03 MED ORDER — PANTOPRAZOLE SODIUM 40 MG PO TBEC: 40.0000 mg | DELAYED_RELEASE_TABLET | Freq: Two times a day (BID) | ORAL | Status: DC

## 2016-01-03 MED ORDER — BOOST / RESOURCE BREEZE PO LIQD: 1.0000 | Freq: Three times a day (TID) | ORAL | 0 refills | Status: AC

## 2016-01-03 MED ORDER — PANTOPRAZOLE SODIUM 40 MG PO TBEC: 40.0000 mg | DELAYED_RELEASE_TABLET | Freq: Two times a day (BID) | ORAL | 0 refills | Status: AC

## 2016-01-03 NOTE — Progress Notes (Signed)
[facility] Progress Note  Mark Callahan 60 y.o. 01-25-1955  CC:  Epigastric pain, GI bleed   Subjective:  Patient had 1 bowel movement yesterday with some dark stool but no bowel movement today.. Tolerating diet. Denied chest pain or shortness of breath  ROS : Negative for  abdominal pain, nausea, vomiting   Objective: Vital signs in last 24 hours: Vitals:   01/03/16 0534 01/03/16 0601  BP: (!) 146/95 (!) 140/91  Pulse: 92   Resp: 16   Temp: 97.6 F (36.4 C)     Physical Exam:  General:  Alert, cooperative, no distress, appears stated age  Head:  Normocephalic, without obvious abnormality, atraumatic  Eyes:  , EOM's intact,   Lungs:   Clear to auscultation bilaterally, respirations unlabored  Heart:  Regular rate and rhythm, S1, S2 normal  Abdomen:   Soft, non-tender, bowel sounds active all four quadrants,  no masses,   Extremities: Extremities normal, atraumatic, no  edema  Pulses: 2+ and symmetric    Lab Results:  Recent Labs  01/02/16 0101 01/02/16 0824 01/03/16 0530  NA 142  --  138  K 2.9*  --  3.7  CL 114*  --  105  CO2 24  --  25  GLUCOSE 103*  --  112*  BUN 7  --  6  CREATININE 0.77  --  0.82  CALCIUM 7.5*  --  8.4*  MG  --  1.9  --    No results for input(s): AST, ALT, ALKPHOS, BILITOT, PROT, ALBUMIN in the last 72 hours.  Recent Labs  01/01/16 0343  01/02/16 0824 01/03/16 0530  WBC 10.6*  --   --  9.5  HGB 6.7*  < > 9.4* 10.4*  HCT 19.8*  < > 27.5* 30.5*  MCV 87.6  --   --  86.6  PLT 266  --   --  387  < > = values in this interval not displayed. No results for input(s): LABPROT, INR in the last 72 hours.    Assessment/Plan: - Melena with EGD showing large gastric ulcer as well as small duodenal ulcer. Clean base without any active bleeding. - Blood loss anemia. Hemoglobin stable. - Epigastric pain. Resolved.  Recommendations ------------------------- - H Pylori serology negative. Patient with negative CT PE as well as  CT abdomen with contrast in November 2017. - Change Protonix to po twice a day on discharge, to continue for 8-12 weeks - Follow-up with Dr. [name] in 2-4 weeks after discharge. Avoid NSAIDs. - GI will sign off. Okay to discharge from GI standpoint. Call us back if needed  Otis Brace MD, [place] 01/03/2016, 11:28 AM  Pager [phone]  If no answer or after 5 PM call [phone]

## 2016-01-04 ENCOUNTER — Encounter (HOSPITAL_COMMUNITY): Payer: Self-pay | Admitting: Gastroenterology

## 2016-01-04 LAB — TYPE AND SCREEN
ABO/RH(D): A POS
ANTIBODY SCREEN: NEGATIVE
UNIT DIVISION: 0
UNIT DIVISION: 0
Unit division: 0
Unit division: 0

## 2016-01-04 NOTE — Discharge Summary (Signed)
Physician Discharge Summary  Mark Callahan [id number] DOB: Dec 26, 1955 DOA: 12/31/2015  PCP: Mark Hector, MD  Admit date: 12/31/2015 Discharge date: 01/03/2016  Admitted From: Home.,  Disposition:  Home  Recommendations for Outpatient Follow-up:  1. Follow up with PCP in 1-2 weeks 2. Please obtain BMP/CBC in one week 3. Please follow up on the biopsy results of the EGD.  4. PLEASE follow up with Dr Mark Callahan in 2 to 4 weeks.     Discharge Condition:stable.  CODE STATUS:full code.  Diet recommendation: Heart Healthy Brief/Interim Summary: Patient is a 60 year old male with hyperlipidemia who presented to [facility] With rectal bleeding and syncopal episode . Admitted to step down for further evaluation. He received about 4 units of prbc and repeat H&H stable around 10. He underwent EGD by GI and discharged on po protonix. Recommended to follow upwith GI .   Discharge Diagnoses:  Principal Problem:   Acute blood loss anemia Active Problems:   GI bleed   Hyperlipidemia   Lactic acidosis   Dehydration   Acute blood loss anemia Secondary to GI bleed :Secondary to excessive aspirin and NSAID use, hemoglobin 4.2 with time of admission,  - admitted to SDU, received 4 units of prbc transfusion - No NSAIDs, placed on PPI drip, GI consulted, EGD today showing  LA Grade A esophagitis. Small hiatal hernia.  Very large but benign-appearing non-bleeding gastric ulcer with pigmented material.  Hematin (altered blood/coffee-ground-like  material) in the gastric body.  One smaller cratered non-bleeding duodenal ulcer with pigmented material.  - Anemia panel show low ferritin levels.  - transfuse to keep a hemoglobin of greater than 8.  -  H pylori serologies are negative.   - clear liquid diet , and advanced as tolerated.  -  po PPI BID on discharge.  - GI recommended repeat endoscopy in 2 to 3 months to evaluate healing of the ulcer.   Hypokalemia: repleted, repeat K in  am. Magnesium levels are normal.   Hyperlipidemia - Continue statin  Lactic acidosis, Dehydration - improved.    Discharge Instructions  Discharge Instructions    Discharge instructions    Complete by:  As directed    Follow up with Dr Mark Callahan at [facility] gastroenterology as recommended.  Please follow up with PCP in one week.  Avoid NSAIDS , including aspirin, ibuprofen, diclofenac.  Please obtain a cbc to check your hemoglobin in one week.       Medication List    STOP taking these medications   aspirin 325 MG tablet   aspirin EC 81 MG tablet   diclofenac 75 MG EC tablet Commonly known as:  VOLTAREN   DOCOSAHEXAENOIC ACID PO   oxyCODONE-acetaminophen 10-325 MG tablet Commonly known as:  PERCOCET   predniSONE 10 MG tablet Commonly known as:  DELTASONE     TAKE these medications   acetaminophen 500 MG tablet Commonly known as:  TYLENOL Take 1,000 mg by mouth every 6 (six) hours as needed for moderate pain.   atorvastatin 40 MG tablet Commonly known as:  LIPITOR Take 40 mg by mouth every evening.   B-12 PO Take 1 tablet by mouth daily with lunch.   B-COMPLEX PO Take 1 tablet by mouth daily.   diazepam 5 MG tablet Commonly known as:  VALIUM Take 5 mg by mouth every 12 (twelve) hours as needed for anxiety.   feeding supplement Liqd Take 1 Container by mouth 3 (three) times daily between meals.   HYDROcodone-acetaminophen  10-325 MG tablet Commonly known as:  NORCO Take 1 tablet by mouth every 8 (eight) hours as needed for pain. Start taking on:  02/01/2016   Iron 325 (65 Fe) MG Tabs Take 162.5 mg by mouth daily.   MULTI-VITAMINS Tabs Take 1 tablet by mouth daily.   pantoprazole 40 MG tablet Commonly known as:  PROTONIX Take 1 tablet (40 mg total) by mouth 2 (two) times daily.   pyridOXINE 50 MG tablet Commonly known as:  B-6 Take 100 mg by mouth every evening.   Vitamin D3 1000 units Caps Take 1,000 Units by mouth daily.       Follow-up Information    Mark Briscoe Burns III, MD. Schedule an appointment as soon as possible for a visit in 1 week(s).   Specialty:  Internal Medicine Contact information: [facility] [address] [place], [place] Follow up.   Specialty:  Gastroenterology Why:  as recommended.  Contact information: [address] [place] [address] [phone]          No Known Allergies  Consultations:  Gastroenterology.    Procedures/Studies: Ct Angio Chest Pe W Or Wo Contrast  Result Date: 12/08/2015 CLINICAL DATA:  Upper chest pain for 6 weeks.  Elevated D-dimer. EXAM: CT ANGIOGRAPHY CHEST WITH CONTRAST TECHNIQUE: Multidetector CT imaging of the chest was performed using the standard protocol during bolus administration of intravenous contrast. Multiplanar CT image reconstructions and MIPs were obtained to evaluate the vascular anatomy. CONTRAST:  75 cc Isovue 370. COMPARISON:  None. FINDINGS: Cardiovascular: No pulmonary embolus is seen. Heart size is normal. No pericardial effusion. A few scattered calcific aortic and coronary atherosclerotic calcifications are identified. Mediastinum/Nodes: No enlarged mediastinal, hilar, or axillary lymph nodes. Thyroid gland, trachea, and esophagus demonstrate no significant findings. Lungs/Pleura: Lungs are clear. No pleural effusion or pneumothorax. Upper Abdomen: No acute abnormality. Musculoskeletal: Scattered Schmorl's nodes throughout the thoracic spine are noted. No lytic or sclerotic lesion is identified. Review of the MIP images confirms the above findings. IMPRESSION: Negative for pulmonary embolus. No acute disease or finding to explain the patient's symptoms. Calcific aortic and coronary atherosclerosis. Thoracic spondylosis. Electronically Signed   By: Inge Rise M.D.   On: 12/08/2015 10:51   Ct Abdomen Pelvis W Contrast  Result Date:  12/11/2015 CLINICAL DATA:  Epigastric abdominal pain. EXAM: CT ABDOMEN AND PELVIS WITH CONTRAST TECHNIQUE: Multidetector CT imaging of the abdomen and pelvis was performed using the standard protocol following bolus administration of intravenous contrast. CONTRAST:  176mL ISOVUE-300 IOPAMIDOL (ISOVUE-300) INJECTION 61% COMPARISON:  None. FINDINGS: Lower chest: Visualized lung bases are unremarkable. Hepatobiliary: No gallstones are noted.  The liver appears normal. Pancreas: Normal. Spleen: Normal. Adrenals/Urinary Tract: Adrenal glands appear normal. 8.6 cm exophytic simple cyst is seen arising from lower pole of left kidney. No hydronephrosis or renal obstruction is noted. Small nonobstructive calculus is noted in lower pole collecting system of right kidney. Urinary bladder appears normal. Stomach/Bowel: The appendix appears normal. There is no evidence of bowel obstruction. Vascular/Lymphatic: Atherosclerosis of abdominal aorta is noted without aneurysm formation. No significant adenopathy is noted. Reproductive: Normal prostate gland. Other: No abnormal fluid collection is noted. Musculoskeletal: Severe degenerative disc disease is noted at L5-S1. IMPRESSION: 8.6 cm simple left renal cyst. Small nonobstructive right renal calculus. No hydronephrosis or renal obstruction is noted. Aortic atherosclerosis. No other significant abnormality seen in the abdomen or pelvis. Electronically Signed  By: Marijo Conception, M.D.   On: 12/11/2015 09:00   Dg Chest Port 1 View  Result Date: 12/31/2015 CLINICAL DATA:  Rectal bleeding. EXAM: PORTABLE CHEST 1 VIEW COMPARISON:  CT 12/08/2015 . FINDINGS: Mediastinum and hilar structures normal. Cardiomegaly with normal pulmonary vascularity. No focal infiltrate. No pleural effusion or pneumothorax. Prior cervical spine fusion. IMPRESSION: No acute cardiopulmonary disease. Electronically Signed   By: Marcello Moores  Register   On: 12/31/2015 10:33       Subjective: No new  complaints.   Discharge Exam: Vitals:   01/03/16 0534 01/03/16 0601  BP: (!) 146/95 (!) 140/91  Pulse: 92   Resp: 16   Temp: 97.6 F (36.4 C)    Vitals:   01/02/16 1323 01/02/16 2123 01/03/16 0534 01/03/16 0601  BP: (!) 145/85 133/87 (!) 146/95 (!) 140/91  Pulse: 86 88 92   Resp: 16 16 16    Temp: 97.9 F (36.6 C) 98.7 F (37.1 C) 97.6 F (36.4 C)   TempSrc: Oral Oral Oral   SpO2: 100% 100% 100%   Weight:      Height:        General: Pt is alert, awake, not in acute distress Cardiovascular: RRR, S1/S2 +, no rubs, no gallops Respiratory: CTA bilaterally, no wheezing, no rhonchi Abdominal: Soft, NT, ND, bowel sounds + Extremities: no edema, no cyanosis    The results of significant diagnostics from this hospitalization (including imaging, microbiology, ancillary and laboratory) are listed below for reference.     Microbiology: Recent Results (from the past 240 hour(s))  Culture, blood (routine x 2)     Status: None (Preliminary result)   Collection Time: 12/31/15 10:04 AM  Result Value Ref Range Status   Specimen Description BLOOD LEFT FA  Final   Special Requests   Final    BOTTLES DRAWN AEROBIC AND ANAEROBIC AER 11ML ANA 14ML   Culture NO GROWTH 4 DAYS  Final   Report Status PENDING  Incomplete  Culture, blood (routine x 2)     Status: None (Preliminary result)   Collection Time: 12/31/15 11:40 AM  Result Value Ref Range Status   Specimen Description BLOOD RIGHT FA  Final   Special Requests   Final    BOTTLES DRAWN AEROBIC AND ANAEROBIC AER 10ML ANA 12ML   Culture NO GROWTH 4 DAYS  Final   Report Status PENDING  Incomplete  MRSA PCR Screening     Status: None   Collection Time: 12/31/15  1:48 PM  Result Value Ref Range Status   MRSA by PCR NEGATIVE NEGATIVE Final    Comment:        The GeneXpert MRSA Assay (FDA approved for NASAL specimens only), is one component of a comprehensive MRSA colonization surveillance program. It is not intended to diagnose  MRSA infection nor to guide or monitor treatment for MRSA infections.      Labs: BNP (last 3 results) No results for input(s): BNP in the last 8760 hours. Basic Metabolic Panel:  Recent Labs Lab 12/31/15 1003 01/01/16 0343 01/02/16 0101 01/02/16 0824 01/03/16 0530  NA 139 142 142  --  138  K 3.8 3.8 2.9*  --  3.7  CL 108 115* 114*  --  105  CO2 17* 22 24  --  25  GLUCOSE 262* 109* 103*  --  112*  BUN 32* 18 7  --  6  CREATININE 1.11 0.82 0.77  --  0.82  CALCIUM 7.8* 7.4* 7.5*  --  8.4*  MG  --   --   --  1.9  --    Liver Function Tests:  Recent Labs Lab 12/31/15 1003  AST 25  ALT 14*  ALKPHOS 25*  BILITOT 0.2*  PROT 4.8*  ALBUMIN 1.9*   No results for input(s): LIPASE, AMYLASE in the last 168 hours. No results for input(s): AMMONIA in the last 168 hours. CBC:  Recent Labs Lab 12/31/15 1003 12/31/15 1504  01/01/16 0343 01/01/16 1718 01/02/16 0101 01/02/16 0824 01/03/16 0530  WBC 16.7* 14.2*  --  10.6*  --   --   --  9.5  NEUTROABS 13.1*  --   --   --   --   --   --   --   HGB 4.2* 5.3*  < > 6.7* 9.3* 9.1* 9.4* 10.4*  HCT 13.4* 15.8*  < > 19.8* 26.7* 25.7* 27.5* 30.5*  MCV 89.3 87.8  --  87.6  --   --   --  86.6  PLT 566* 351  --  266  --   --   --  387  < > = values in this interval not displayed. Cardiac Enzymes:  Recent Labs Lab 12/31/15 1003  TROPONINI <0.03   BNP: Invalid input(s): POCBNP CBG: No results for input(s): GLUCAP in the last 168 hours. D-Dimer No results for input(s): DDIMER in the last 72 hours. Hgb A1c No results for input(s): HGBA1C in the last 72 hours. Lipid Profile No results for input(s): CHOL, HDL, LDLCALC, TRIG, CHOLHDL, LDLDIRECT in the last 72 hours. Thyroid function studies No results for input(s): TSH, T4TOTAL, T3FREE, THYROIDAB in the last 72 hours.  Invalid input(s): FREET3 Anemia work up No results for input(s): VITAMINB12, FOLATE, FERRITIN, TIBC, IRON, RETICCTPCT in the last 72 hours. Urinalysis No  results found for: COLORURINE, APPEARANCEUR, LABSPEC, PHURINE, GLUCOSEU, HGBUR, BILIRUBINUR, KETONESUR, PROTEINUR, UROBILINOGEN, NITRITE, LEUKOCYTESUR Sepsis Labs Invalid input(s): PROCALCITONIN,  WBC,  LACTICIDVEN Microbiology Recent Results (from the past 240 hour(s))  Culture, blood (routine x 2)     Status: None (Preliminary result)   Collection Time: 12/31/15 10:04 AM  Result Value Ref Range Status   Specimen Description BLOOD LEFT FA  Final   Special Requests   Final    BOTTLES DRAWN AEROBIC AND ANAEROBIC AER 11ML ANA 14ML   Culture NO GROWTH 4 DAYS  Final   Report Status PENDING  Incomplete  Culture, blood (routine x 2)     Status: None (Preliminary result)   Collection Time: 12/31/15 11:40 AM  Result Value Ref Range Status   Specimen Description BLOOD RIGHT FA  Final   Special Requests   Final    BOTTLES DRAWN AEROBIC AND ANAEROBIC AER 10ML ANA 12ML   Culture NO GROWTH 4 DAYS  Final   Report Status PENDING  Incomplete  MRSA PCR Screening     Status: None   Collection Time: 12/31/15  1:48 PM  Result Value Ref Range Status   MRSA by PCR NEGATIVE NEGATIVE Final    Comment:        The GeneXpert MRSA Assay (FDA approved for NASAL specimens only), is one component of a comprehensive MRSA colonization surveillance program. It is not intended to diagnose MRSA infection nor to guide or monitor treatment for MRSA infections.      Time coordinating discharge: Over 30 minutes  SIGNED:   Hosie Poisson, MD  [facility] 01/04/2016, 9:58 AM Pager   If 7PM-7AM, please contact night-coverage [email or web address] Password [email or web address]

## 2016-01-05 LAB — CULTURE, BLOOD (ROUTINE X 2)
Culture: NO GROWTH
Culture: NO GROWTH

## 2016-01-27 ENCOUNTER — Ambulatory Visit: Payer: BLUE CROSS/BLUE SHIELD | Admitting: Hematology and Oncology

## 2018-02-03 IMAGING — DX DG CHEST 1V PORT
1 series · 1 of 1 positions shown · non-contrast
Comparison: CT 12/08/2015 .

CLINICAL DATA: Rectal bleeding.

EXAM:
PORTABLE CHEST 1 VIEW

[chest ap]
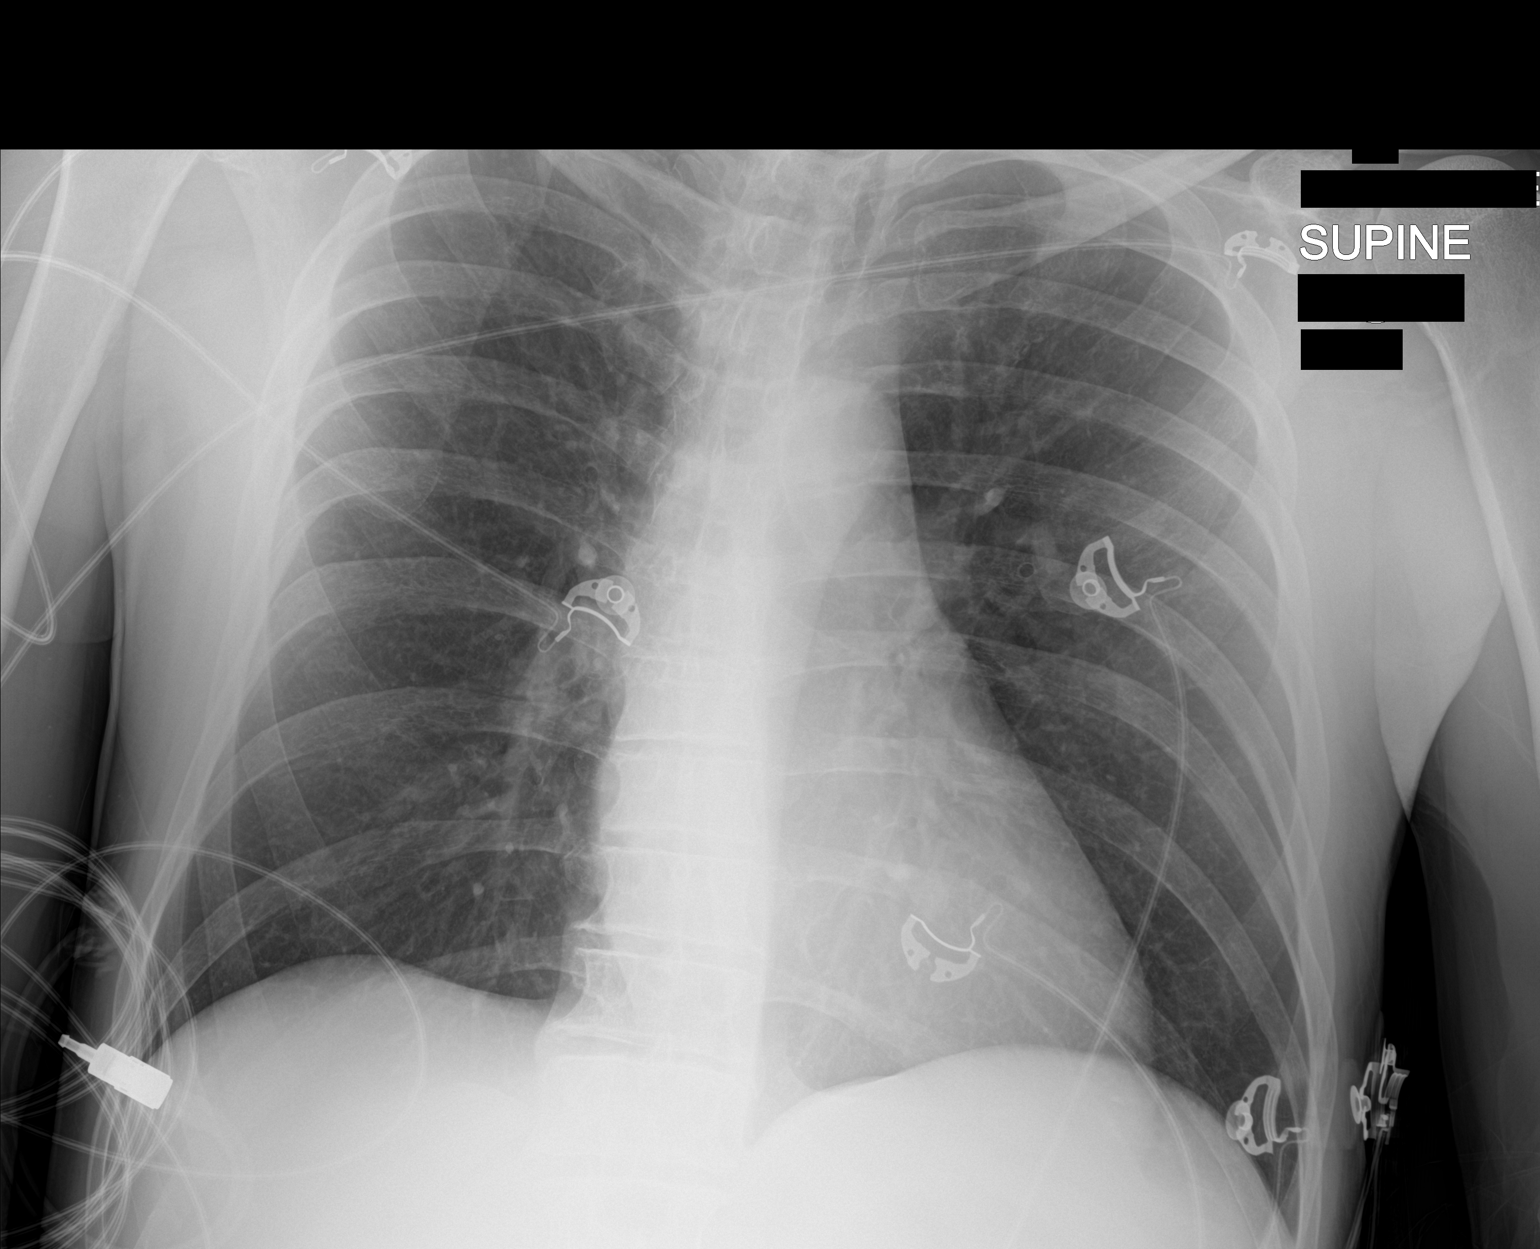

[1 of 1 positions shown; findings below may reference images not displayed]

FINDINGS: Mediastinum and hilar structures normal. Cardiomegaly with normal
pulmonary vascularity. No focal infiltrate. No pleural effusion or
pneumothorax. Prior cervical spine fusion.
IMPRESSION: No acute cardiopulmonary disease.
